# Patient Record
Sex: Male | Born: 1935 | Race: Black or African American | Hispanic: No | Marital: Married | Smoking: Former smoker
Health system: Southern US, Community
[De-identification: ages and names within clinical notes are randomized; demographics above are authoritative.]

## PROBLEM LIST (undated history)

## (undated) DIAGNOSIS — I219 Acute myocardial infarction, unspecified: Secondary | ICD-10-CM

## (undated) DIAGNOSIS — I10 Essential (primary) hypertension: Secondary | ICD-10-CM

## (undated) DIAGNOSIS — I639 Cerebral infarction, unspecified: Secondary | ICD-10-CM

## (undated) DIAGNOSIS — E119 Type 2 diabetes mellitus without complications: Secondary | ICD-10-CM

---

## 2006-07-15 ENCOUNTER — Inpatient Hospital Stay: Payer: Self-pay | Admitting: Internal Medicine

## 2006-07-15 ENCOUNTER — Other Ambulatory Visit: Payer: Self-pay

## 2006-07-15 IMAGING — CT CT CHEST W/ CM
1 of 2 series · 14 of 32 positions shown, 19 images · non-contrast
Comparison: none

REASON FOR EXAM: Widened mediastinum, evaluate for thoracic aneurysm
COMMENTS:   LMP: (Male)

[Series 4: aaa · axial · 0.70mm/px · z∈[-562,-274]mm · 14 of 108 slices shown, 19 images]
[im 6/108  soft-tissue]
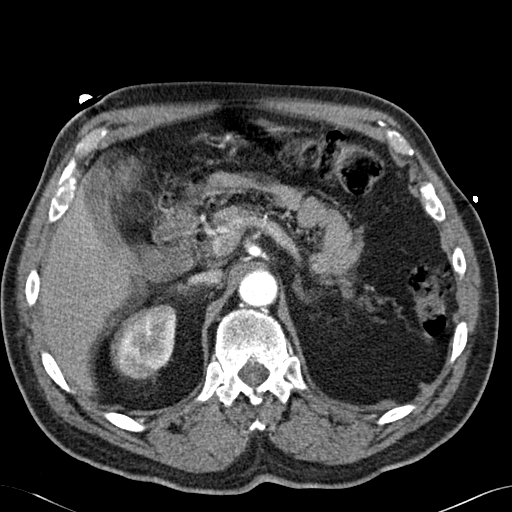
[im 6/108  bone]
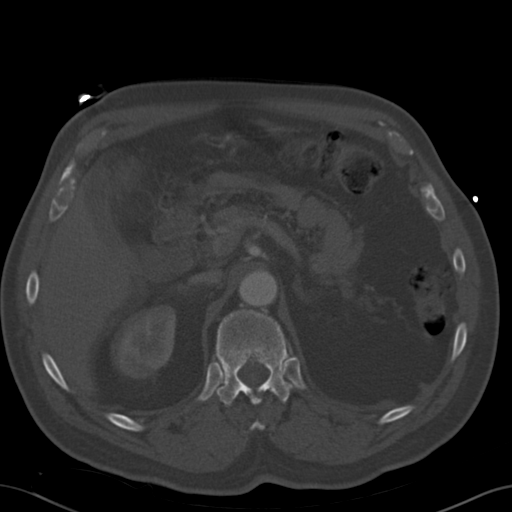
[im 17/108  soft-tissue]
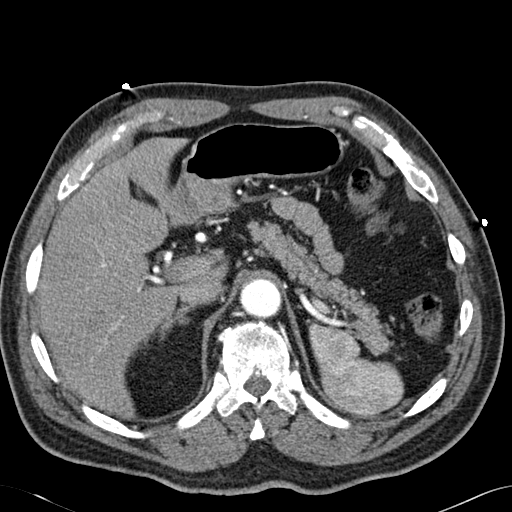
[im 23/108  soft-tissue]
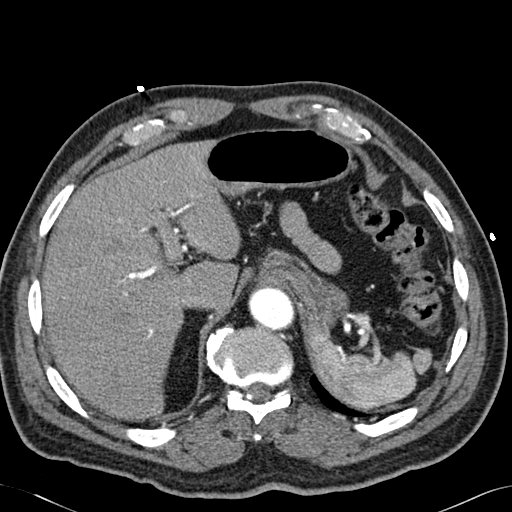
[im 29/108  soft-tissue]
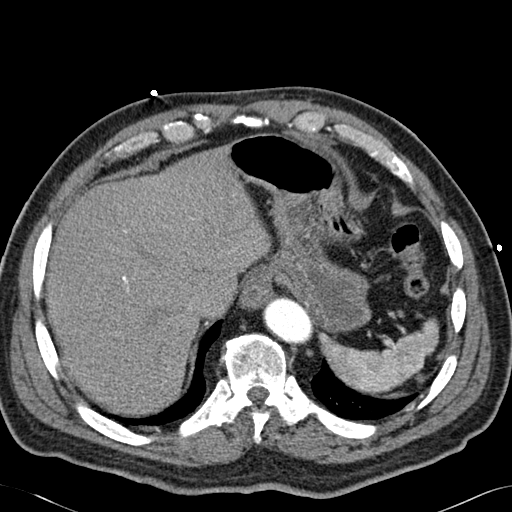
[im 40/108  soft-tissue]
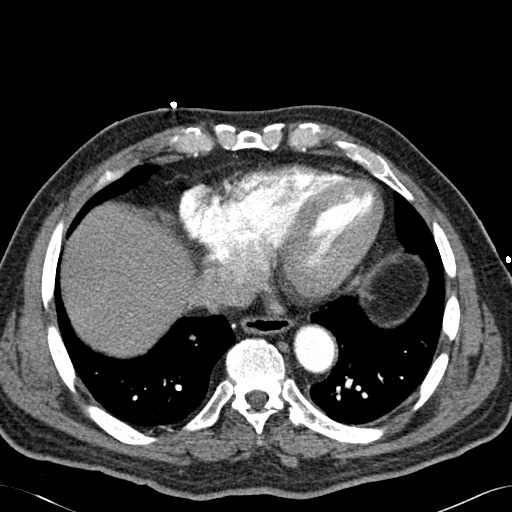
[im 46/108  soft-tissue]
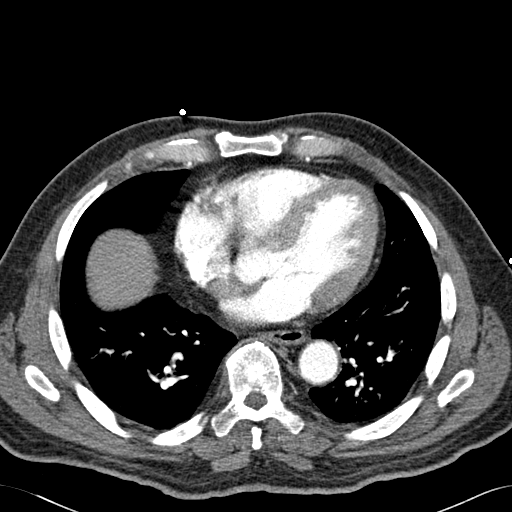
[im 57/108  soft-tissue]
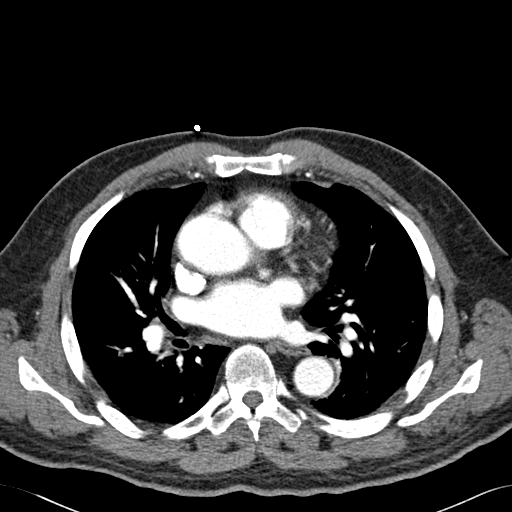
[im 62/108  soft-tissue]
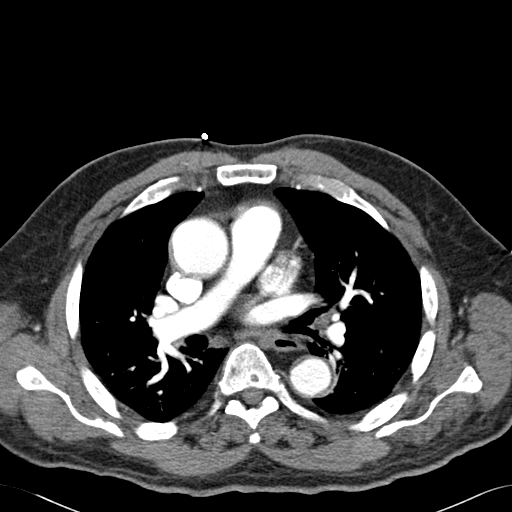
[im 68/108  soft-tissue]
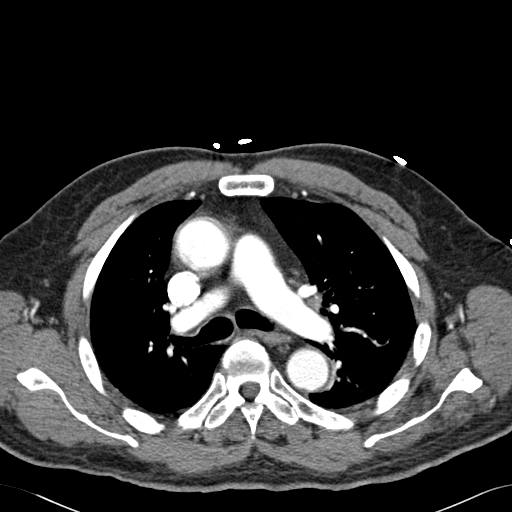
[im 68/108  bone]
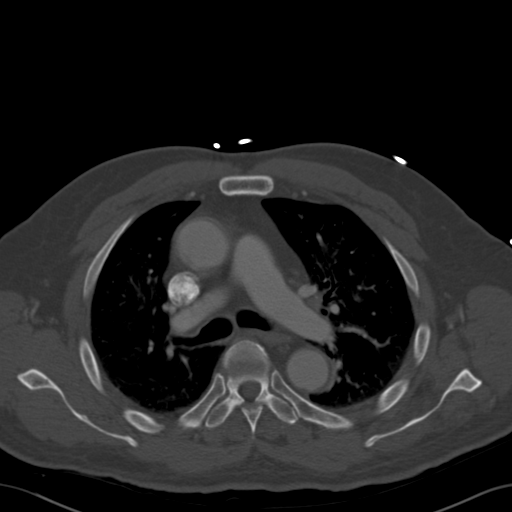
[im 79/108  soft-tissue]
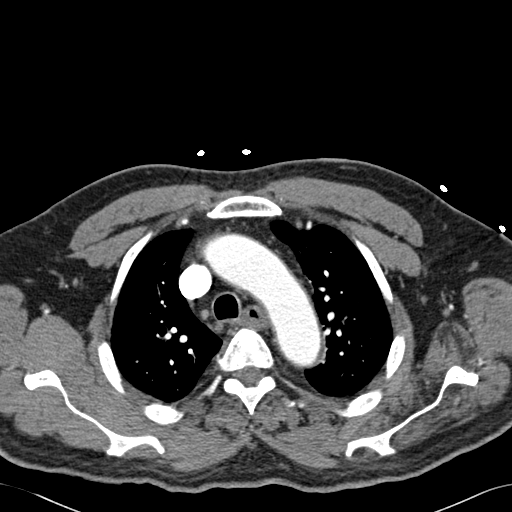
[im 85/108  soft-tissue]
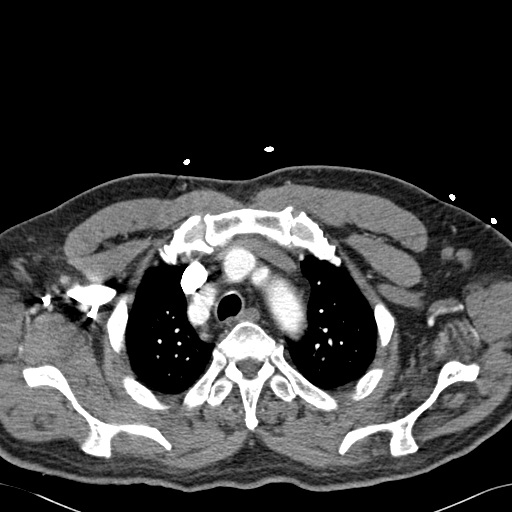
[im 85/108  lung]
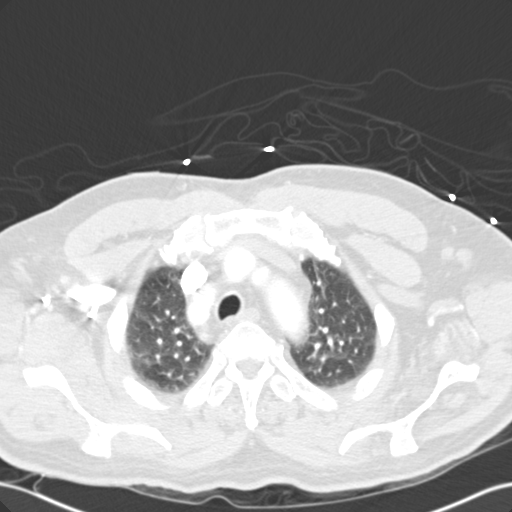
[im 91/108  soft-tissue]
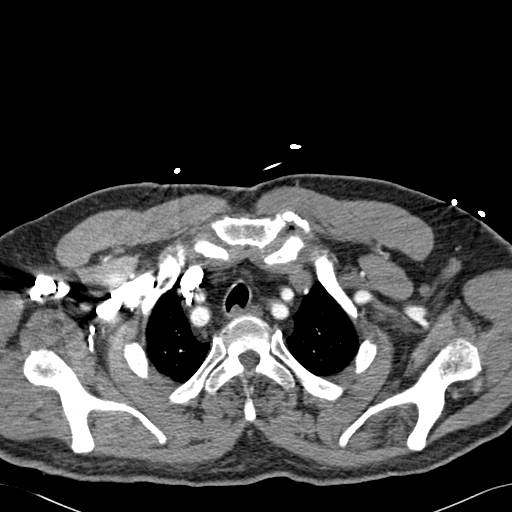
[im 91/108  lung]
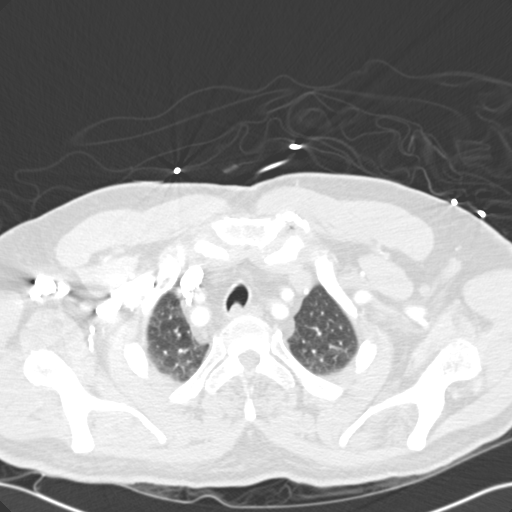
[im 96/108  lung]
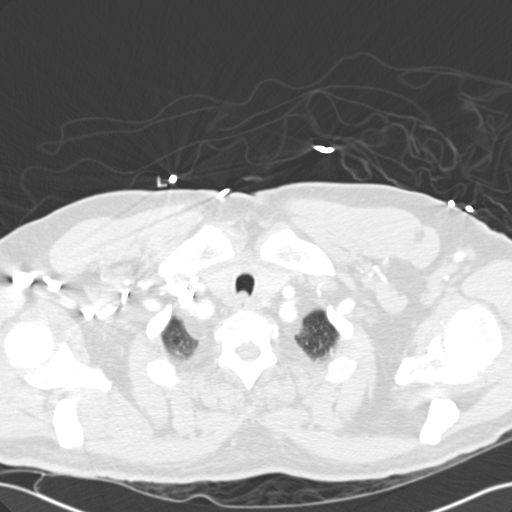
[im 102/108  soft-tissue]
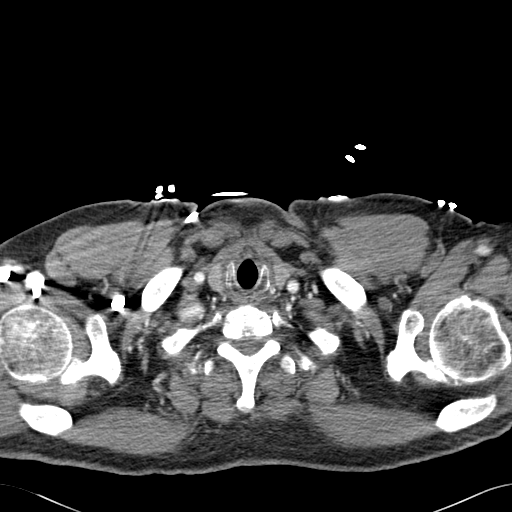
[im 102/108  lung]
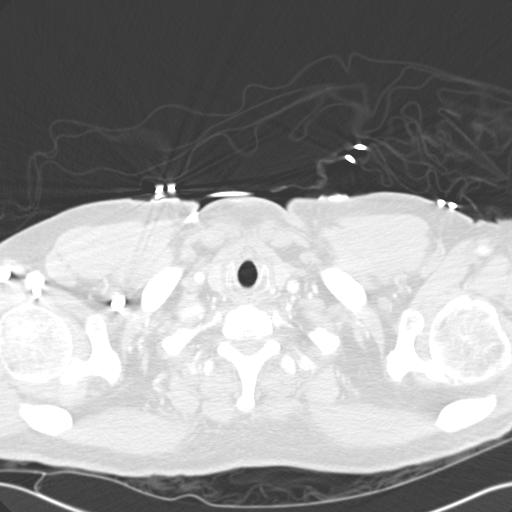

[14 of 32 positions shown; findings below may reference images not displayed]

PROCEDURE:     CT  - CT CHEST WITH CONTRAST  - [DATE] [DATE]

RESULT:     The patient received 100 ml of [G2] for this study.

The cardiac chambers are very mildly enlarged. There is no evidence of a
pericardial effusion. The caliber of the thoracic aorta is normal. There is
mild tortuosity of the ascending thoracic aorta. The central pulmonary
arteries are normal in caliber. There are no findings to suggest pulmonary
emboli. No pathologic sized mediastinal or hilar lymph nodes are present.
The retrosternal soft tissues are normal in appearance. I see no evidence of
a pleural effusion. At lung window settings, there is a small amount of
apical pleural thickening. The interstitial markings of both lungs are
mildly increased. I see no alveolar infiltrates and no abnormal nodules.
Within the upper abdomen, there is some motion artifact associated with
images of the lower portions of the liver. The observed portions of the
upper aspect of the liver appear normal. There is a hiatal hernia present.
IMPRESSION: 1.  I do not see evidence of a thoracic aortic aneurysm. There is mild
tortuosity of the ascending and descending thoracic aorta.
2.  The cardiac chambers are top normal to mildly enlarged.
3.  The pulmonary vascularity is mildly prominent centrally but there is no
evidence of significant alveolar edema. I cannot exclude minimal
interstitial edema.
4.  I see no pathologic sized mediastinal or hilar lymph nodes and there is
no evidence of a pleural effusion.

The findings were called to the [HOSPITAL] the conclusion of
the study.

## 2006-07-16 ENCOUNTER — Other Ambulatory Visit: Payer: Self-pay

## 2006-12-12 ENCOUNTER — Encounter: Payer: Self-pay | Admitting: Family Medicine

## 2007-01-07 ENCOUNTER — Encounter: Payer: Self-pay | Admitting: Family Medicine

## 2007-02-07 ENCOUNTER — Encounter: Payer: Self-pay | Admitting: Family Medicine

## 2008-06-19 ENCOUNTER — Emergency Department: Payer: Self-pay | Admitting: Emergency Medicine

## 2010-07-25 ENCOUNTER — Encounter: Payer: Self-pay | Admitting: Nurse Practitioner

## 2010-07-25 ENCOUNTER — Encounter: Payer: Self-pay | Admitting: Cardiothoracic Surgery

## 2010-08-07 ENCOUNTER — Encounter: Payer: Self-pay | Admitting: Cardiothoracic Surgery

## 2010-08-07 ENCOUNTER — Encounter: Payer: Self-pay | Admitting: Nurse Practitioner

## 2010-09-07 ENCOUNTER — Encounter: Payer: Self-pay | Admitting: Nurse Practitioner

## 2010-09-07 ENCOUNTER — Encounter: Payer: Self-pay | Admitting: Cardiothoracic Surgery

## 2010-10-08 ENCOUNTER — Encounter: Payer: Self-pay | Admitting: Nurse Practitioner

## 2010-10-08 ENCOUNTER — Encounter: Payer: Self-pay | Admitting: Cardiothoracic Surgery

## 2010-11-07 ENCOUNTER — Encounter: Payer: Self-pay | Admitting: Cardiothoracic Surgery

## 2010-11-07 ENCOUNTER — Encounter: Payer: Self-pay | Admitting: Nurse Practitioner

## 2010-12-08 ENCOUNTER — Encounter: Payer: Self-pay | Admitting: Nurse Practitioner

## 2010-12-08 ENCOUNTER — Encounter: Payer: Self-pay | Admitting: Cardiothoracic Surgery

## 2011-08-14 ENCOUNTER — Emergency Department: Payer: Self-pay | Admitting: Unknown Physician Specialty

## 2011-08-14 LAB — COMPREHENSIVE METABOLIC PANEL
Albumin: 3.2 g/dL — ABNORMAL LOW (ref 3.4–5.0)
Anion Gap: 8 (ref 7–16)
BUN: 9 mg/dL (ref 7–18)
Calcium, Total: 8.6 mg/dL (ref 8.5–10.1)
Co2: 25 mmol/L (ref 21–32)
EGFR (African American): >60
EGFR (Non-African Amer.): >60
Glucose: 116 mg/dL — ABNORMAL HIGH (ref 65–99)
Osmolality: 275 (ref 275–301)
Potassium: 3.5 mmol/L (ref 3.5–5.1)
SGOT(AST): 23 U/L (ref 15–37)
Sodium: 138 mmol/L (ref 136–145)
Total Protein: 7.3 g/dL (ref 6.4–8.2)

## 2011-08-14 LAB — CBC WITH DIFFERENTIAL/PLATELET
Basophil %: 0.1 %
Eosinophil #: 0 10*3/uL (ref 0.0–0.7)
Eosinophil %: 0 %
HCT: 39.8 % — ABNORMAL LOW (ref 40.0–52.0)
HGB: 12.9 g/dL — ABNORMAL LOW (ref 13.0–18.0)
Lymphocyte %: 7.6 %
MCH: 27.8 pg (ref 26.0–34.0)
MCHC: 32.5 g/dL (ref 32.0–36.0)
MCV: 86 fL (ref 80–100)
Monocyte #: 0.7 x10 3/mm (ref 0.2–1.0)
Neutrophil #: 11.1 10*3/uL — ABNORMAL HIGH (ref 1.4–6.5)
Neutrophil %: 86.5 %
RBC: 4.65 10*6/uL (ref 4.40–5.90)
WBC: 12.8 10*3/uL — ABNORMAL HIGH (ref 3.8–10.6)

## 2011-08-14 LAB — URINALYSIS, COMPLETE
Bilirubin,UR: NEGATIVE
Glucose,UR: >=500 mg/dL (ref 0–75)
Nitrite: POSITIVE
Ph: 6 (ref 4.5–8.0)
RBC,UR: 11 /HPF (ref 0–5)
Squamous Epithelial: NONE SEEN
WBC UR: 30 /HPF (ref 0–5)

## 2011-08-16 LAB — URINE CULTURE

## 2012-07-24 ENCOUNTER — Encounter: Payer: Self-pay | Admitting: Cardiothoracic Surgery

## 2012-07-24 ENCOUNTER — Encounter: Payer: Self-pay | Admitting: Nurse Practitioner

## 2012-08-04 LAB — WOUND CULTURE

## 2012-08-06 ENCOUNTER — Encounter: Payer: Self-pay | Admitting: Nurse Practitioner

## 2012-08-06 ENCOUNTER — Encounter: Payer: Self-pay | Admitting: Cardiothoracic Surgery

## 2012-09-06 ENCOUNTER — Encounter: Payer: Self-pay | Admitting: Cardiothoracic Surgery

## 2012-09-06 ENCOUNTER — Encounter: Payer: Self-pay | Admitting: Nurse Practitioner

## 2012-10-07 ENCOUNTER — Encounter: Payer: Self-pay | Admitting: Cardiothoracic Surgery

## 2016-05-09 ENCOUNTER — Emergency Department: Payer: Medicare Other

## 2016-05-09 ENCOUNTER — Encounter: Payer: Self-pay | Admitting: Emergency Medicine

## 2016-05-09 ENCOUNTER — Inpatient Hospital Stay
Admission: EM | Admit: 2016-05-09 | Discharge: 2016-05-11 | DRG: 066 | Disposition: A | Discharge status: Home / Self Care | Payer: Medicare Other | Attending: Internal Medicine | Admitting: Internal Medicine

## 2016-05-09 DIAGNOSIS — I63412 Cerebral infarction due to embolism of left middle cerebral artery: Secondary | ICD-10-CM | POA: Diagnosis not present

## 2016-05-09 DIAGNOSIS — R4701 Aphasia: Secondary | ICD-10-CM | POA: Diagnosis present

## 2016-05-09 DIAGNOSIS — F0390 Unspecified dementia without behavioral disturbance: Secondary | ICD-10-CM | POA: Diagnosis present

## 2016-05-09 DIAGNOSIS — R471 Dysarthria and anarthria: Secondary | ICD-10-CM | POA: Diagnosis present

## 2016-05-09 DIAGNOSIS — E119 Type 2 diabetes mellitus without complications: Secondary | ICD-10-CM | POA: Diagnosis present

## 2016-05-09 DIAGNOSIS — R29703 NIHSS score 3: Secondary | ICD-10-CM | POA: Diagnosis present

## 2016-05-09 DIAGNOSIS — I69398 Other sequelae of cerebral infarction: Secondary | ICD-10-CM

## 2016-05-09 DIAGNOSIS — Z8249 Family history of ischemic heart disease and other diseases of the circulatory system: Secondary | ICD-10-CM

## 2016-05-09 DIAGNOSIS — R4781 Slurred speech: Secondary | ICD-10-CM | POA: Diagnosis not present

## 2016-05-09 DIAGNOSIS — Z87891 Personal history of nicotine dependence: Secondary | ICD-10-CM

## 2016-05-09 DIAGNOSIS — Z79899 Other long term (current) drug therapy: Secondary | ICD-10-CM

## 2016-05-09 DIAGNOSIS — I252 Old myocardial infarction: Secondary | ICD-10-CM

## 2016-05-09 DIAGNOSIS — E785 Hyperlipidemia, unspecified: Secondary | ICD-10-CM | POA: Diagnosis present

## 2016-05-09 DIAGNOSIS — I6521 Occlusion and stenosis of right carotid artery: Secondary | ICD-10-CM | POA: Diagnosis present

## 2016-05-09 DIAGNOSIS — Z7984 Long term (current) use of oral hypoglycemic drugs: Secondary | ICD-10-CM

## 2016-05-09 DIAGNOSIS — E876 Hypokalemia: Secondary | ICD-10-CM | POA: Diagnosis not present

## 2016-05-09 DIAGNOSIS — I10 Essential (primary) hypertension: Secondary | ICD-10-CM | POA: Diagnosis present

## 2016-05-09 DIAGNOSIS — G459 Transient cerebral ischemic attack, unspecified: Secondary | ICD-10-CM | POA: Diagnosis present

## 2016-05-09 HISTORY — DX: Type 2 diabetes mellitus without complications: E11.9

## 2016-05-09 HISTORY — DX: Cerebral infarction, unspecified: I63.9

## 2016-05-09 HISTORY — DX: Acute myocardial infarction, unspecified: I21.9

## 2016-05-09 HISTORY — DX: Essential (primary) hypertension: I10

## 2016-05-09 LAB — URINALYSIS, ROUTINE W REFLEX MICROSCOPIC
Bilirubin Urine: NEGATIVE
GLUCOSE, UA: 150 mg/dL — AB
Hgb urine dipstick: NEGATIVE
KETONES UR: NEGATIVE mg/dL
LEUKOCYTES UA: NEGATIVE
Nitrite: NEGATIVE
PH: 6 (ref 5.0–8.0)
PROTEIN: NEGATIVE mg/dL
Specific Gravity, Urine: 1.016 (ref 1.005–1.030)

## 2016-05-09 LAB — COMPREHENSIVE METABOLIC PANEL
ALBUMIN: 3.9 g/dL (ref 3.5–5.0)
ALT: 13 U/L — AB (ref 17–63)
AST: 22 U/L (ref 15–41)
Alkaline Phosphatase: 81 U/L (ref 38–126)
Anion gap: 7 (ref 5–15)
BUN: 14 mg/dL (ref 6–20)
CHLORIDE: 102 mmol/L (ref 101–111)
CO2: 26 mmol/L (ref 22–32)
CREATININE: 0.99 mg/dL (ref 0.61–1.24)
Calcium: 8.8 mg/dL — ABNORMAL LOW (ref 8.9–10.3)
GFR calc Af Amer: >60 mL/min (ref >60)
Glucose, Bld: 130 mg/dL — ABNORMAL HIGH (ref 65–99)
POTASSIUM: 3.3 mmol/L — AB (ref 3.5–5.1)
SODIUM: 135 mmol/L (ref 135–145)
Total Bilirubin: 0.4 mg/dL (ref 0.3–1.2)
Total Protein: 7 g/dL (ref 6.5–8.1)

## 2016-05-09 LAB — GLUCOSE, CAPILLARY
GLUCOSE-CAPILLARY: 143 mg/dL — AB (ref 65–99)
Glucose-Capillary: 93 mg/dL (ref 65–99)

## 2016-05-09 LAB — DIFFERENTIAL
BASOS ABS: 0 10*3/uL (ref 0–0.1)
BASOS PCT: 1 %
EOS ABS: 0.1 10*3/uL (ref 0–0.7)
Eosinophils Relative: 2 %
Lymphocytes Relative: 32 %
Lymphs Abs: 1.2 10*3/uL (ref 1.0–3.6)
MONOS PCT: 8 %
Monocytes Absolute: 0.3 10*3/uL (ref 0.2–1.0)
NEUTROS ABS: 2.2 10*3/uL (ref 1.4–6.5)
Neutrophils Relative %: 57 %

## 2016-05-09 LAB — CBC
HCT: 41.5 % (ref 40.0–52.0)
HEMOGLOBIN: 13.6 g/dL (ref 13.0–18.0)
MCH: 28.9 pg (ref 26.0–34.0)
MCHC: 32.8 g/dL (ref 32.0–36.0)
MCV: 88.2 fL (ref 80.0–100.0)
PLATELETS: 184 10*3/uL (ref 150–440)
RBC: 4.7 MIL/uL (ref 4.40–5.90)
RDW: 13.6 % (ref 11.5–14.5)
WBC: 3.8 10*3/uL (ref 3.8–10.6)

## 2016-05-09 LAB — TROPONIN I

## 2016-05-09 LAB — PROTIME-INR
INR: 0.89
PROTHROMBIN TIME: 12 s (ref 11.4–15.2)

## 2016-05-09 LAB — APTT: aPTT: 27 seconds (ref 24–36)

## 2016-05-09 MED ORDER — ATORVASTATIN CALCIUM 20 MG PO TABS
40.0000 mg | ORAL_TABLET | Freq: Every day | ORAL | Status: DC
  Administered 2016-05-09 – 2016-05-11 (×3): 40 mg via ORAL
  Filled 2016-05-09 (×3): qty 2

## 2016-05-09 MED ORDER — ALBUTEROL SULFATE (2.5 MG/3ML) 0.083% IN NEBU: 2.5000 mg | INHALATION_SOLUTION | RESPIRATORY_TRACT | Status: DC | PRN

## 2016-05-09 MED ORDER — INSULIN ASPART 100 UNIT/ML ~~LOC~~ SOLN
0.0000 [IU] | Freq: Three times a day (TID) | SUBCUTANEOUS | Status: DC
  Administered 2016-05-10: 12:00:00 1 [IU] via SUBCUTANEOUS
  Filled 2016-05-09: qty 1

## 2016-05-09 MED ORDER — ASPIRIN 81 MG PO CHEW
324.0000 mg | CHEWABLE_TABLET | Freq: Once | ORAL | Status: AC
  Administered 2016-05-09: 324 mg via ORAL
  Filled 2016-05-09: qty 4

## 2016-05-09 MED ORDER — ONDANSETRON HCL 4 MG PO TABS: 4.0000 mg | ORAL_TABLET | Freq: Four times a day (QID) | ORAL | Status: DC | PRN

## 2016-05-09 MED ORDER — BISACODYL 10 MG RE SUPP
10.0000 mg | Freq: Every day | RECTAL | Status: DC | PRN
  Filled 2016-05-09: qty 1

## 2016-05-09 MED ORDER — LISINOPRIL 20 MG PO TABS
20.0000 mg | ORAL_TABLET | Freq: Two times a day (BID) | ORAL | Status: DC
  Administered 2016-05-09 – 2016-05-11 (×4): 20 mg via ORAL
  Filled 2016-05-09 (×4): qty 1

## 2016-05-09 MED ORDER — POTASSIUM CHLORIDE 20 MEQ PO PACK
40.0000 meq | PACK | Freq: Once | ORAL | Status: AC
  Administered 2016-05-09: 23:00:00 40 meq via ORAL
  Filled 2016-05-09: qty 2

## 2016-05-09 MED ORDER — ASPIRIN-DIPYRIDAMOLE ER 25-200 MG PO CP12
1.0000 | ORAL_CAPSULE | Freq: Every day | ORAL | Status: DC
  Administered 2016-05-10 – 2016-05-11 (×2): 1 via ORAL
  Filled 2016-05-09 (×2): qty 1

## 2016-05-09 MED ORDER — HYDRALAZINE HCL 20 MG/ML IJ SOLN: 10.0000 mg | Freq: Four times a day (QID) | INTRAMUSCULAR | Status: DC | PRN

## 2016-05-09 MED ORDER — POLYETHYLENE GLYCOL 3350 17 G PO PACK: 17.0000 g | PACK | Freq: Every day | ORAL | Status: DC | PRN

## 2016-05-09 MED ORDER — METFORMIN HCL 500 MG PO TABS
500.0000 mg | ORAL_TABLET | Freq: Every day | ORAL | Status: DC
  Administered 2016-05-10: 08:00:00 500 mg via ORAL
  Filled 2016-05-09: qty 1

## 2016-05-09 MED ORDER — AMLODIPINE BESYLATE 10 MG PO TABS
10.0000 mg | ORAL_TABLET | Freq: Every day | ORAL | Status: DC
  Administered 2016-05-10 – 2016-05-11 (×2): 10 mg via ORAL
  Filled 2016-05-09 (×2): qty 1

## 2016-05-09 MED ORDER — DOCUSATE SODIUM 100 MG PO CAPS
100.0000 mg | ORAL_CAPSULE | Freq: Two times a day (BID) | ORAL | Status: DC
  Administered 2016-05-09 – 2016-05-11 (×4): 100 mg via ORAL
  Filled 2016-05-09 (×4): qty 1

## 2016-05-09 MED ORDER — ENOXAPARIN SODIUM 40 MG/0.4ML ~~LOC~~ SOLN
40.0000 mg | SUBCUTANEOUS | Status: DC
  Administered 2016-05-09 – 2016-05-10 (×2): 40 mg via SUBCUTANEOUS
  Filled 2016-05-09 (×2): qty 0.4

## 2016-05-09 MED ORDER — SODIUM CHLORIDE 0.9% FLUSH
3.0000 mL | Freq: Two times a day (BID) | INTRAVENOUS | Status: DC
  Administered 2016-05-09 – 2016-05-10 (×3): 3 mL via INTRAVENOUS

## 2016-05-09 MED ORDER — ONDANSETRON HCL 4 MG/2ML IJ SOLN: 4.0000 mg | Freq: Four times a day (QID) | INTRAMUSCULAR | Status: DC | PRN

## 2016-05-09 MED ORDER — FERROUS SULFATE 325 (65 FE) MG PO TABS
325.0000 mg | ORAL_TABLET | Freq: Every day | ORAL | Status: DC
  Administered 2016-05-10: 325 mg via ORAL
  Filled 2016-05-09: qty 1

## 2016-05-09 NOTE — ED Provider Notes (Signed)
[facility] Emergency Department Provider Note  Time seen: 6:44 PM  I have reviewed the triage vital signs and the nursing notes.   HISTORY  Chief Complaint Aphasia    HPI Stephen [name] is a 83 y.o. male with a past medical history of diabetes, hypertension, MI, CVA, presents to the emergency department for difficulty speaking.According to family around 2 PM today the patient was noted to be acting normal. No deficits, family states the patient is usually alert and oriented 4 conversant normally. Ambulates with a cane. Patient took a nap after lunch and upon awakening this afternoon they noted the patient have difficulty speaking. They state the patient cannot get his words out and appeared to be confused. They called EMS, EMS states upon arrival the patient began speaking but appeared to be confused and appeared to continue to have difficulty getting his words out with very slow speech. Upon arrival to the emergency department patient is largely unchanged he is speaking but is having difficulty getting his words out with very slow speech. He appears to be confused, states the year is 37. Patient is able to follow commands.  Past Medical History:  Diagnosis Date  . Diabetes mellitus without complication (HCC)   . Hypertension   . MI (myocardial infarction)   . Stroke (cerebrum) (HCC)     There are no active problems to display for this patient.   History reviewed. No pertinent surgical history.  Prior to Admission medications   Not on File    No Known Allergies  No family history on file.  Social History Social History  Substance Use Topics  . Smoking status: Former Games developer  . Smokeless tobacco: Never Used  . Alcohol use No    Review of Systems Constitutional: Negative for fever Cardiovascular: Negative for chest pain. Respiratory: Negative for shortness of breath. Gastrointestinal: Negative for abdominal pain Neurological: Negative for  headaches, focal weakness or numbness.Patient denies any difficulty speaking although he is clearly having trouble. Denies any confusion or difficulty thinking. 10-point ROS otherwise negative.  ____________________________________________   PHYSICAL EXAM:  VITAL SIGNS: ED Triage Vitals  Enc Vitals Group     BP 05/09/16 1813 (!) 181/105     Pulse Rate 05/09/16 1813 82     Resp 05/09/16 1813 18     Temp --      Temp src --      SpO2 05/09/16 1813 96 %     Weight 05/09/16 1835 195 lb (88.5 kg)     Height 05/09/16 1835 6' (1.829 m)     Head Circumference --      Peak Flow --      Pain Score --      Pain Loc --      Pain Edu? --      Excl. in GC? --     Constitutional: Alert and oriented. Well appearing and in no distress. Eyes: Normal exam ENT   Head: Normocephalic and atraumatic   Mouth/Throat: Mucous membranes are moist. Cardiovascular: Normal rate, regular rhythm. No murmur Respiratory: Normal respiratory effort without tachypnea nor retractions. Breath sounds are clear  Gastrointestinal: Soft and nontender. No distention.   Musculoskeletal: Nontender with normal range of motion in all extremities.  Neurologic:  Normal speech and language. No gross focal neurologic deficits Skin:  Skin is warm, dry and intact.  Psychiatric: Mood and affect are normal.   ____________________________________________    EKG  Addendum interpreted by myself shows normal sinus rhythm  at 71 bpm, narrow QRS, left axis deviation, largely normal intervals and nonspecific ST changes. No ST elevation.  ____________________________________________    RADIOLOGY  IMPRESSION: 1. No acute intracranial process. 2. ASPECTS is 10. 3. 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 Severe chronic small vessel ischemic disease.  ____________________________________________   INITIAL IMPRESSION / ASSESSMENT AND PLAN / ED COURSE  Pertinent labs & imaging results that were available during my care of  the patient were reviewed by me and considered in my medical decision making (see chart for details).  Patient presents to the emergency department by EMS for difficulty speaking. Code stroke initiated upon arrival. Overall the patient appears well but does appear to continue to have difficulty speaking and getting his words out. Appears to be quite sluggish and slow with responses with confusion saying the year is 37. Currently awaiting CT scan read however on my review patient appears to have an old CVA but no acute abnormality. We will have our telemetry neurologist speak with the patient.    Patient has been seen by telemetry neurology and they believe the patient is likely suffering from a TIA, they've an NIH stroke scale of 0 currently. They recommended no TPA but admitted to the hospital for further workup including MRI.  His labs are resulted largely within normal limits. Troponin negative. CT head shows old CVA but no acute abnormality. Patient will be admitted to the hospital for further workup.  NIH Stroke Scale   Interval: arriva Time: 7:21 PM Person Administering Scale: Loreli Debruler  Administer stroke scale items in the order listed. Record performance in each category after each subscale exam. Do not go back and change scores. Follow directions provided for each exam technique. Scores should reflect what the patient does, not what the clinician thinks the patient can do. The clinician should record answers while administering the exam and work quickly. Except where indicated, the patient should not be coached (i.e., repeated requests to patient to make a special effort).   1a  Level of consciousness: 0=alert; keenly responsive  1b. LOC questions:  0=Performs both tasks correctly  1c. LOC commands: 0=Performs both tasks correctly  2.  Best Gaze: 0=normal  3.  Visual: 0=No visual loss  4. Facial Palsy: 0=Normal symmetric movement  5a.  Motor left arm: 0=No drift, limb  holds 90 (or 45) degrees for full 10 seconds  5b.  Motor right arm: 0=No drift, limb holds 90 (or 45) degrees for full 10 seconds  6a. motor left leg: 0=No drift, limb holds 90 (or 45) degrees for full 10 seconds  6b  Motor right leg:  0=No drift, limb holds 90 (or 45) degrees for full 10 seconds  7. Limb Ataxia: 0=Absent  8.  Sensory: 0=Normal; no sensory loss  9. Best Language:  1=Mild to moderate aphasia; some obvious loss of fluency or facility of comprehension without significant limitation on ideas expressed or form of expression.  10. Dysarthria: 1=Mild to moderate, patient slurs at least some words and at worst, can be understood with some difficulty  11. Extinction and Inattention: 0=No abnormality  12. Distal motor function: 0=Normal   Total:   2       ____________________________________________   FINAL CLINICAL IMPRESSION(S) / ED DIAGNOSES  CVA Dysarthria    Minna Antis, MD 05/09/16 Ernestina [place]

## 2016-05-09 NOTE — H&P (Signed)
[employer] - [place] at [facility]   PATIENT NAME: Stephen [name]    MR#:  [id number]  DATE OF BIRTH:  [birth year]  DATE OF ADMISSION:  05/09/2016  PRIMARY CARE PHYSICIAN: [facility]   REQUESTING/REFERRING PHYSICIAN: Dr. Lenard Lance  CHIEF COMPLAINT:   Chief Complaint  Patient presents with  . Aphasia    HISTORY OF PRESENT ILLNESS:  Stephen [name]  is a 83 y.o. male with a known history of CVA, hypertension, diabetes, mild dementia presents to the emergency room with acute onset of slurred speech at 2 PM today. By the time he arrived to the emergency room symptoms are improving. At this time his speech is close to normal with minimal slurred speech as per family. Patient is a poor historian. Does have memory issues at baseline. He was confused per family previously and is slowly improving. Most of the history obtained by reviewing old records and discussing with family at bedside. He does have chronic right-sided weakness from prior stroke which is unchanged. No trouble swallowing. No change in vision. CT scan of the head showed old strokes but nothing acute.  PAST MEDICAL HISTORY:   Past Medical History:  Diagnosis Date  . Diabetes mellitus without complication (HCC)   . Hypertension   . MI (myocardial infarction)   . Stroke (cerebrum) (HCC)     PAST SURGICAL HISTORY:  History reviewed. No pertinent surgical history.  SOCIAL HISTORY:   Social History  Substance Use Topics  . Smoking status: Former Games developer  . Smokeless tobacco: Never Used  . Alcohol use No    FAMILY HISTORY:   Family History  Problem Relation Age of Onset  . Hypertension Other     DRUG ALLERGIES:  No Known Allergies  REVIEW OF SYSTEMS:   Review of Systems  Constitutional: Positive for malaise/fatigue. Negative for chills, fever and weight loss.  HENT: Negative for hearing loss and nosebleeds.   Eyes: Negative for blurred vision, double vision and pain.   Respiratory: Negative for cough, hemoptysis, sputum production, shortness of breath and wheezing.   Cardiovascular: Negative for chest pain, palpitations, orthopnea and leg swelling.  Gastrointestinal: Negative for abdominal pain, constipation, diarrhea, nausea and vomiting.  Genitourinary: Negative for dysuria and hematuria.  Musculoskeletal: Negative for back pain, falls and myalgias.  Skin: Negative for rash.  Neurological: Positive for speech change and focal weakness. Negative for dizziness, tremors, sensory change, seizures and headaches.  Endo/Heme/Allergies: Does not bruise/bleed easily.  Psychiatric/Behavioral: Negative for depression and memory loss. The patient is not nervous/anxious.     MEDICATIONS AT HOME:   Prior to Admission medications   Medication Sig Start Date End Date Taking? Authorizing Provider  amLODipine (NORVASC) 10 MG tablet Take 10 mg by mouth daily. 05/09/16  Yes Historical Provider, MD  atorvastatin (LIPITOR) 40 MG tablet Take 40 mg by mouth daily. 04/11/16  Yes Historical Provider, MD  dipyridamole-aspirin (AGGRENOX) 200-25 MG 12hr capsule Take 1 capsule by mouth daily.   Yes Historical Provider, MD  ferrous sulfate 325 (65 FE) MG tablet Take 325 mg by mouth daily with breakfast.   Yes Historical Provider, MD  IBU 800 MG tablet Take 800 mg by mouth 2 (two) times daily as needed.  05/09/16  Yes Historical Provider, MD  lisinopril (PRINIVIL,ZESTRIL) 20 MG tablet Take 20 mg by mouth 2 (two) times daily.  05/09/16  Yes Historical Provider, MD  metFORMIN (GLUCOPHAGE) 500 MG tablet Take 500 mg by mouth daily. 05/09/16  Yes Historical Provider, MD  VITAL SIGNS:  Blood pressure (!) 169/88, pulse 75, resp. rate 19, height 6' (1.829 m), weight 88.5 kg (195 lb), SpO2 96 %.  PHYSICAL EXAMINATION:  Physical Exam  GENERAL:  83 y.o.-year-old patient lying in the bed with no acute distress.  EYES: Pupils equal, round, reactive to light and accommodation. No scleral icterus.  Extraocular muscles intact.  HEENT: Head atraumatic, normocephalic. Oropharynx and nasopharynx clear. No oropharyngeal erythema, moist oral mucosa  NECK:  Supple, no jugular venous distention. No thyroid enlargement, no tenderness.  LUNGS: Normal breath sounds bilaterally, no wheezing, rales, rhonchi. No use of accessory muscles of respiration.  CARDIOVASCULAR: S1, S2 normal. No murmurs, rubs, or gallops.  ABDOMEN: Soft, nontender, nondistended. Bowel sounds present. No organomegaly or mass.  EXTREMITIES: No pedal edema, cyanosis, or clubbing. + 2 pedal & radial pulses b/l.   NEUROLOGIC: Cranial nerves II through XII are intact. Sensation to fine touch intact all over. Motor strength 4/5 on right side and 5 over 5 on left side. Some slurred speech. PSYCHIATRIC: The patient is alert and Awake. Oriented to place and person but not time. SKIN: No obvious rash, lesion, or ulcer.   LABORATORY PANEL:   CBC  Recent Labs Lab 05/09/16 1834  WBC 3.8  HGB 13.6  HCT 41.5  PLT 184   ------------------------------------------------------------------------------------------------------------------  Chemistries   Recent Labs Lab 05/09/16 1834  NA 135  K 3.3*  CL 102  CO2 26  GLUCOSE 130*  BUN 14  CREATININE 0.99  CALCIUM 8.8*  AST 22  ALT 13*  ALKPHOS 81  BILITOT 0.4   ------------------------------------------------------------------------------------------------------------------  Cardiac Enzymes  Recent Labs Lab 05/09/16 1834  TROPONINI <0.03   ------------------------------------------------------------------------------------------------------------------  RADIOLOGY:  Ct Head Code Stroke W/o Cm  Result Date: 05/09/2016 CLINICAL DATA:  Code stroke. Slurred speech. Word-finding difficulties. Assess for stroke. History of hypertension, diabetes and stroke. EXAM: CT HEAD WITHOUT CONTRAST TECHNIQUE: Contiguous axial images were obtained from the base of the skull through the  vertex without intravenous contrast. COMPARISON:  MRI of the head July 16, 2006 FINDINGS: BRAIN: No intraparenchymal hemorrhage, mass effect nor midline shift. Old large RIGHT superior cerebellar infarct with ex vacuo dilatation fourth ventricle, RIGHT brachium pontis atrophy. Confluent supratentorial white matter hypodensities. No acute large vascular territory infarcts. Similarly prominent bifrontal extra-axial fluid spaces. 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 Basal cisterns are patent. VASCULAR: Moderate calcific atherosclerosis of the carotid siphons with dolichoectasia compatible chronic hypertension. SKULL: No skull fracture. Small LEFT frontal scalp hematoma or mass, recommend direct inspection. LEFT suboccipital scalp lipoma. SINUSES/ORBITS: The mastoid air-cells and included paranasal sinuses are well-aerated.The included ocular globes and orbital contents are non-suspicious. OTHER: None. ASPECTS [facility] Stroke Program Early CT Score) - Ganglionic level infarction (caudate, lentiform nuclei, internal capsule, insula, M1-M3 cortex): 7 - Supraganglionic infarction (M4-M6 cortex): 3 Total score (0-10 with 10 being normal): 10 IMPRESSION: 1. No acute intracranial process. 2. ASPECTS is 10. 3. 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 Severe chronic small vessel ischemic disease. Critical Value/emergent results were called by telephone at the time of interpretation on 05/09/2016 at 6:52 pm to Dr. Minna Antis , who verbally acknowledged these results. Electronically Signed   By: Awilda Metro M.D.   On: 05/09/2016 18:53     IMPRESSION AND PLAN:   * Acute Onset slurred speech due to CVA CT scan of the head showed nothing acute. -Check MRI of the brain, Carotid dopplers, Echo - Start Aggrenox and statin. - Lovenox for DVT prophylaxis. - PT/OT/Speech consult as needed  per symptoms - Neuro checks every 4 hours for 24 hours. - Consult neurology.  * Hypertension. Amlodipine and lisinopril.  * Diabetes mellitus type  2. On metformin. Add sliding scale insulin.  * Mild dementia. Watch for inpatient delirium.  * DVT prophylaxis with Lovenox.   All the records are reviewed and case discussed with ED provider. Management plans discussed with the patient, family and they are in agreement.  CODE STATUS: FULL CODE  TOTAL TIME TAKING CARE OF THIS PATIENT: 40 minutes.   Milagros Loll R M.D on 05/09/2016 at 8:14 PM  Between 7am to 6pm - Pager - [phone]  After 6pm go to [email or web address] - password [email or web address]  [facility] [place] [facility]  Office  [phone]  CC: Primary care physician; [facility]  Note: This dictation was prepared with Dragon dictation along with smaller phrase technology. Any transcriptional errors that result from this process are unintentional.

## 2016-05-09 NOTE — ED Notes (Signed)
Spoke with MRI tech. States it will be in the morning before MRI is able to be completed.

## 2016-05-09 NOTE — ED Triage Notes (Signed)
Patient from home via [facility]. Family reports at approximately 2pm patient began having slurred speech and trouble getting words out. Upon arrival patient is having acute dysarthria. Oriented to person and place. No motor deficits noted at this time.

## 2016-05-09 NOTE — ED Notes (Signed)
CODE STROKE CALLED TO [phone] 

## 2016-05-10 ENCOUNTER — Observation Stay (HOSPITAL_BASED_OUTPATIENT_CLINIC_OR_DEPARTMENT_OTHER)
Admit: 2016-05-10 | Discharge: 2016-05-10 | Disposition: A | Discharge status: Home / Self Care | Payer: Medicare Other | Attending: Internal Medicine | Admitting: Internal Medicine

## 2016-05-10 ENCOUNTER — Observation Stay: Payer: Medicare Other

## 2016-05-10 DIAGNOSIS — G459 Transient cerebral ischemic attack, unspecified: Secondary | ICD-10-CM

## 2016-05-10 DIAGNOSIS — I639 Cerebral infarction, unspecified: Secondary | ICD-10-CM | POA: Diagnosis not present

## 2016-05-10 LAB — GLUCOSE, CAPILLARY
GLUCOSE-CAPILLARY: 104 mg/dL — AB (ref 65–99)
Glucose-Capillary: 85 mg/dL (ref 65–99)
Glucose-Capillary: 123 mg/dL — ABNORMAL HIGH (ref 65–99)
Glucose-Capillary: 89 mg/dL (ref 65–99)

## 2016-05-10 LAB — LIPID PANEL
Cholesterol: 176 mg/dL (ref 0–200)
HDL: 57 mg/dL (ref >40)
LDL CALC: 104 mg/dL — AB (ref 0–99)
TRIGLYCERIDES: 77 mg/dL (ref <150)
Total CHOL/HDL Ratio: 3.1 RATIO
VLDL: 15 mg/dL (ref 0–40)

## 2016-05-10 LAB — ECHOCARDIOGRAM COMPLETE
Height: 72 in
WEIGHTICAEL: 3200 [oz_av]

## 2016-05-10 LAB — TSH: TSH: 1.131 u[IU]/mL (ref 0.350–4.500)

## 2016-05-10 MED ORDER — CLOPIDOGREL BISULFATE 75 MG PO TABS: 75.0000 mg | ORAL_TABLET | Freq: Every day | ORAL | 0 refills | Status: DC

## 2016-05-10 MED ORDER — POLYETHYLENE GLYCOL 3350 17 G PO PACK: 17.0000 g | PACK | Freq: Every day | ORAL | 0 refills | Status: DC | PRN

## 2016-05-10 MED ORDER — POTASSIUM CHLORIDE CRYS ER 20 MEQ PO TBCR
40.0000 meq | EXTENDED_RELEASE_TABLET | Freq: Once | ORAL | Status: AC
  Administered 2016-05-10: 11:00:00 40 meq via ORAL
  Filled 2016-05-10: qty 2

## 2016-05-10 MED ORDER — ASPIRIN EC 81 MG PO TBEC: 81.0000 mg | DELAYED_RELEASE_TABLET | Freq: Every day | ORAL | Status: DC

## 2016-05-10 NOTE — Evaluation (Signed)
Clinical/Bedside Swallow Evaluation Patient Details  Name: Stephen [name] MRN: [id number] Date of Birth: [birth year]  Today's Date: 05/10/2016 Time: SLP Start Time (ACUTE ONLY): 0950 SLP Stop Time (ACUTE ONLY): 1050 SLP Time Calculation (min) (ACUTE ONLY): 60 min  Past Medical History:  Past Medical History:  Diagnosis Date  . Diabetes mellitus without complication (HCC)   . Hypertension   . MI (myocardial infarction)   . Stroke (cerebrum) [facility])    Past Surgical History: History reviewed. No pertinent surgical history. HPI:  Pt  is a 83 y.o. male with a known history of CVA, hypertension, diabetes, mild dementia presents to the emergency room with acute onset of slurred speech at 2 PM today. By the time he arrived to the emergency room symptoms are improving. At this time his speech is close to normal with minimal slurred speech as per family. Patient is a poor historian. Does have memory issues at baseline. He was confused per family previously and is slowly improving. Most of the history obtained by reviewing old records and discussing with family at bedside. He does have chronic right-sided weakness from prior stroke which is unchanged. Currently, pt is verbally responsive and conversive; speech fluency c/b min hesitations intermittently and slightly mumbled but overall intelligible. Will f/u w/ family if close to his baseline secondary to old CVA. NSG indicated adequate toleration of pills w/ water.    Assessment / Plan / Recommendation Clinical Impression  Pt appears to adequately tolerate oral intake w/ reduced risk for aspiration following just general aspiration precautions. Pt consumed sips of thin liquids via cup/straw then solids w/ no overt s/s of aspiration noted. Oral phase appeared wfl for bolus management of trials given. Pt does wear dentures; slightly declined condition but adequate for pt. Pt is able to feed self w/ min setup assist. NSG recently gave all Pills w/ water w/ no  deficits noted. Pt appears at his baseline w/ swallowing; he denies any decline in his speech-language abilities stating he is "talking fine now". Family has indicated pt appears to be communicating at his baseline. Recommend a regular/mech soft diet(meats cut) w/ thin liquids; general aspiration precautions. NSG to reconsult if any change in status while admitted. Pt/family agreed. SLP Visit Diagnosis: Dysphagia, unspecified (R13.10)    Aspiration Risk   (reduced)    Diet Recommendation  Regular/Mech Soft(meats); Thin liquids. General aspiration precautions.  Medication Administration: Whole meds with liquid (or whole in Puree if needed)    Other  Recommendations Recommended Consults:  (none indicated) Oral Care Recommendations: Oral care BID;Staff/trained caregiver to provide oral care   Follow up Recommendations None      Frequency and Duration  n/a         Prognosis Prognosis for Safe Diet Advancement: Good      Swallow Study   General Date of Onset: 05/09/16 HPI: Pt  is a 83 y.o. male with a known history of CVA, hypertension, diabetes, mild dementia presents to the emergency room with acute onset of slurred speech at 2 PM today. By the time he arrived to the emergency room symptoms are improving. At this time his speech is close to normal with minimal slurred speech as per family. Patient is a poor historian. Does have memory issues at baseline. He was confused per family previously and is slowly improving. Most of the history obtained by reviewing old records and discussing with family at bedside. He does have chronic right-sided weakness from prior stroke which is unchanged. Currently, pt is  verbally responsive and conversive; speech fluency c/b min hesitations intermittently and slightly mumbled but overall intelligible. Will f/u w/ family if close to his baseline secondary to old CVA. NSG indicated adequate toleration of pills w/ water.  Type of Study: Bedside Swallow  Evaluation Previous Swallow Assessment: none indicated Diet Prior to this Study: Regular;Thin liquids Temperature Spikes Noted: No (wbc not elevated) Respiratory Status: Room air History of Recent Intubation: No Behavior/Cognition: Alert;Cooperative;Pleasant mood;Distractible;Requires cueing (min) Oral Cavity Assessment: Within Functional Limits Oral Care Completed by SLP: Recent completion by staff Oral Cavity - Dentition: Dentures, top;Dentures, bottom;Poor condition Vision: Functional for self-feeding Self-Feeding Abilities: Able to feed self;Needs assist;Needs set up Patient Positioning: Upright in bed Baseline Vocal Quality: Normal;Low vocal intensity (slightly mumbled speech - may be at baseline?) Volitional Cough: Strong Volitional Swallow: Able to elicit    Oral/Motor/Sensory Function Overall Oral Motor/Sensory Function: Within functional limits   Ice Chips Ice chips: Not tested   Thin Liquid Thin Liquid: Within functional limits Presentation: Cup;Self Fed;Straw (~6 ozs) Other Comments: pt was drinking thin liquids upon entering room    Nectar Thick Nectar Thick Liquid: Not tested   Honey Thick Honey Thick Liquid: Not tested   Puree Puree: Within functional limits Presentation: Self Fed;Spoon (4 trials)   Solid   GO   Solid: Within functional limits (mech soft/moist) Presentation: Self Fed (3 trials)    Functional Assessment Tool Used: clinicial judgement Functional Limitations: Swallowing Swallow Current Status (U9811): At least 1 percent but less than 20 percent impaired, limited or restricted Swallow Goal Status [phone]): At least 1 percent but less than 20 percent impaired, limited or restricted Swallow Discharge Status [phone]): At least 1 percent but less than 20 percent impaired, limited or restricted     Jerilynn Som, MS, CCC-SLP Watson,Katherine 05/10/2016,11:58 AM

## 2016-05-10 NOTE — Plan of Care (Signed)
Problem: Education: Goal: Knowledge of [facility] Education information/materials will improve Outcome: Progressing Pt likes to be called Fayrene Fearing  Past Medical History:  Diagnosis Date  . Diabetes mellitus without complication (HCC)   . Hypertension   . MI (myocardial infarction)   . Stroke (cerebrum) (HCC)    Pt is well controlled with home medications

## 2016-05-10 NOTE — Evaluation (Signed)
Physical Therapy Evaluation Patient Details Name: Stephen [name] MRN: [id number] DOB: August 09, 1926 Today's Date: 05/10/2016   History of Present Illness  83 yo male with onset of dysarthria, acute L posterior frontal cortex strokes, but has PMHx:  B cerebellar stroke, thalamic and basal ganglia strokes, stroke of corpus callosum, DM, MI   Clinical Impression  Pt was demonstrating some lack of safety awareness today, initially reluctant to use RW. After talking with him about PT concerns he agreed to walk with it and then could see more of the issues that PT did regarding RLE placement and control of standing balance.  He is appropriate for home but will need family to assist him with HHPT to follow up with him.  Follow acutely to make sure he continues to work on standing balance/gait control and asked nursing to continue on with him for walks to assist him for endurance and safety.    Follow Up Recommendations Home health PT;Supervision for mobility/OOB    Equipment Recommendations  Rolling walker with 5" wheels    Recommendations for Other Services       Precautions / Restrictions Precautions Precautions: Fall (telemetry, impulsivity) Restrictions Weight Bearing Restrictions: No      Mobility  Bed Mobility Overal bed mobility: Needs Assistance Bed Mobility: Supine to Sit     Supine to sit: Min guard;Min assist     General bed mobility comments: pt is somewhat impulsive and cannot sequence without some cues as well as help to shift to side of bed  Transfers Overall transfer level: Needs assistance Equipment used: Rolling walker (2 wheeled);1 person hand held assist Transfers: Sit to/from [employer] Sit to Stand: Min guard;Min assist Stand pivot transfers: Min guard;Min assist       General transfer comment: pt has unsteadiness on RLE and does not attend to this until later in gait  Ambulation/Gait Ambulation/Gait assistance: Min assist (to control RLE  missteps) Ambulation Distance (Feet): 45 Feet Assistive device: Rolling walker (2 wheeled);1 person hand held assist Gait Pattern/deviations: Step-through pattern;Step-to pattern;Ataxic;Decreased stride length;Trunk flexed;Wide base of support;Narrow base of support Gait velocity: reduced Gait velocity interpretation: Below normal speed for age/gender General Gait Details: difficulty controlling placement of RLE and has crossover and missteps on RLE but may be baseline from cerebellar stroke  Stairs            Wheelchair Mobility    Modified Rankin (Stroke Patients Only)       Balance Overall balance assessment: Needs assistance Sitting-balance support: Feet supported Sitting balance-Leahy Scale: Fair       Standing balance-Leahy Scale: Fair Standing balance comment: poor dynamic standing balance                             Pertinent Vitals/Pain Pain Assessment: Faces Faces Pain Scale: Hurts little more Pain Location: back with mobility Pain Descriptors / Indicators: Aching (old back surgery) Pain Intervention(s): Limited activity within patient's tolerance;Repositioned    Home Living Family/patient expects to be discharged to:: Private residence Living Arrangements: Spouse/significant other Available Help at Discharge: Family;Available 24 hours/day Type of Home: House Home Access: Stairs to enter Entrance Stairs-Rails: Right;Left;Can reach both;None (each step) Entrance Stairs-Number of Steps: 1+1 with railing on one step Home Layout: One level Home Equipment: Walker - 4 wheels;Cane - single point Additional Comments: needs a regular RW    Prior Function Level of Independence: Independent with assistive device(s);Needs assistance   Gait / Transfers Assistance  Needed: SPC but not safe per family  ADL's / Homemaking Assistance Needed: wife is his caregiver        Hand Dominance   Dominant Hand: Right (R has oscillating tremors)     Extremity/Trunk Assessment   Upper Extremity Assessment Upper Extremity Assessment: Overall WFL for tasks assessed    Lower Extremity Assessment Lower Extremity Assessment: RLE deficits/detail RLE Deficits / Details: coordination deficits on RLE RLE Coordination: decreased fine motor;decreased gross motor    Cervical / Trunk Assessment Cervical / Trunk Assessment: Normal  Communication   Communication: No difficulties  Cognition Arousal/Alertness: Awake/alert Behavior During Therapy: Impulsive Overall Cognitive Status: History of cognitive impairments - at baseline                                        General Comments General comments (skin integrity, edema, etc.): pt is demonstrating some difficulty with controlling steps and turns, but is cuable and with gait belt is manageable for safety    Exercises     Assessment/Plan    PT Assessment Patient needs continued PT services  PT Problem List Decreased range of motion;Decreased activity tolerance;Decreased balance;Decreased mobility;Decreased coordination;Decreased knowledge of use of DME;Decreased safety awareness;Decreased knowledge of precautions       PT Treatment Interventions DME instruction;Gait training;Functional mobility training;Therapeutic activities;Therapeutic exercise;Balance training;Neuromuscular re-education;Stair training;Patient/family education    PT Goals (Current goals can be found in the Care Plan section)  Acute Rehab PT Goals Patient Stated Goal: walk alone and get home PT Goal Formulation: With patient/family Time For Goal Achievement: 05/24/16 Potential to Achieve Goals: Good    Frequency 7X/week   Barriers to discharge Other (comment) (family will need HHPT to support his recovery)      Co-evaluation               End of Session Equipment Utilized During Treatment: Gait belt Activity Tolerance: Patient tolerated treatment well;Other (comment) (impulsivity was  better after pt saw his control of RLE) Patient left: in chair;with call bell/phone within reach;with chair alarm set;with family/visitor present Nurse Communication: Mobility status PT Visit Diagnosis: Other abnormalities of gait and mobility (R26.89);Unsteadiness on feet (R26.81)    Time: 4098-1191 PT Time Calculation (min) (ACUTE ONLY): 31 min   Charges:   PT Evaluation $PT Eval Moderate Complexity: 1 Procedure PT Treatments $Gait Training: 8-22 mins   PT G Codes:   PT G-Codes **NOT FOR INPATIENT CLASS** Functional Assessment Tool Used: AM-PAC 6 Clicks Basic Mobility;Clinical judgement Functional Limitation: Mobility: Walking and moving around Mobility: Walking and Moving Around Current Status (Y7829): At least 20 percent but less than 40 percent impaired, limited or restricted Mobility: Walking and Moving Around Goal Status [id number]): At least 1 percent but less than 20 percent impaired, limited or restricted     Ivar Drape 05/10/2016, 12:59 PM   Samul Dada, PT MS Acute Rehab Dept. Number: [facility] [id number] and [facility] [phone]

## 2016-05-10 NOTE — Care Management (Signed)
Admitted to this facility under observation status with the diagnosis of dysarthia. Lives with wife, Thelma Barge [phone]). Last seen Dr. Darreld Mclean at [facility] 4 months ago. No home health. [facility] in 2013. No home oxygen. Uses a cane to aid in ambulation. Uses [employer] for medications. No falls. Good appetite. Takes care of all basic activities of daily living himself, doesn't drive. Family will transport. Gwenette Greet RN MSN CCM Care Management

## 2016-05-10 NOTE — Care Management Obs Status (Signed)
MEDICARE OBSERVATION STATUS NOTIFICATION   Patient Details  Name: Stephen [name] MRN: [id number] Date of Birth: 1926-03-10   Medicare Observation Status Notification Given:  Yes    Gwenette Greet, RN 05/10/2016, 11:44 AM

## 2016-05-10 NOTE — Care Management (Signed)
Informed that patient for discharge hoe today and in need of physical therapy, social work and walker. [facility] able to accept patient's medicare uhc .  Informed family and primary nurse.

## 2016-05-10 NOTE — Progress Notes (Signed)
[facility] [place] [facility]   PATIENT NAME: Stephen [name]    MR#:  [id number]  DATE OF BIRTH:  06/29/1926  SUBJECTIVE:  CHIEF COMPLAINT:  Patient has dysarthria  REVIEW OF SYSTEMS:  CONSTITUTIONAL: No fever, fatigue or weakness.  EYES: No blurred or double vision.  EARS, NOSE, AND THROAT: No tinnitus or ear pain.  RESPIRATORY: No cough, shortness of breath, wheezing or hemoptysis.  CARDIOVASCULAR: No chest pain, orthopnea, edema.  GASTROINTESTINAL: No nausea, vomiting, diarrhea or abdominal pain.  GENITOURINARY: No dysuria, hematuria.  ENDOCRINE: No polyuria, nocturia,  HEMATOLOGY: No anemia, easy bruising or bleeding SKIN: No rash or lesion. MUSCULOSKELETAL: No joint pain or arthritis.   NEUROLOGIC:Reporting word finding difficulty No tingling, numbness, weakness.  PSYCHIATRY: No anxiety or depression.   DRUG ALLERGIES:  No Known Allergies  VITALS:  Blood pressure (!) 153/75, pulse (!) 58, temperature 98.1 F (36.7 C), temperature source Oral, resp. rate 18, height 6' (1.829 m), weight 90.7 kg (200 lb), SpO2 99 %.  PHYSICAL EXAMINATION:  GENERAL:  83 y.o.-year-old patient lying in the bed with no acute distress.  EYES: Pupils equal, round, reactive to light and accommodation. No scleral icterus. Extraocular muscles intact.  HEENT: Head atraumatic, normocephalic. Oropharynx and nasopharynx clear.  NECK:  Supple, no jugular venous distention. No thyroid enlargement, no tenderness.  LUNGS: Normal breath sounds bilaterally, no wheezing, rales,rhonchi or crepitation. No use of accessory muscles of respiration.  CARDIOVASCULAR: S1, S2 normal. No murmurs, rubs, or gallops.  ABDOMEN: Soft, nontender, nondistended. Bowel sounds present. No organomegaly or mass.  EXTREMITIES: No pedal edema, cyanosis, or clubbing.  NEUROLOGIC: Cranial nerves II through XII are intact. Muscle strength 5/5 in all extremities. Sensation intact. Gait not checked. Has  dysarthria PSYCHIATRIC: The patient is alert and oriented x 3.  SKIN: No obvious rash, lesion, or ulcer.    LABORATORY PANEL:   CBC  Recent Labs Lab 05/09/16 1834  WBC 3.8  HGB 13.6  HCT 41.5  PLT 184   ------------------------------------------------------------------------------------------------------------------  Chemistries   Recent Labs Lab 05/09/16 1834  NA 135  K 3.3*  CL 102  CO2 26  GLUCOSE 130*  BUN 14  CREATININE 0.99  CALCIUM 8.8*  AST 22  ALT 13*  ALKPHOS 81  BILITOT 0.4   ------------------------------------------------------------------------------------------------------------------  Cardiac Enzymes  Recent Labs Lab 05/09/16 1834  TROPONINI <0.03   ------------------------------------------------------------------------------------------------------------------  RADIOLOGY:  Mr Maxine Glenn Head Wo Contrast  Result Date: 05/10/2016 CLINICAL DATA:  Dementia.  Acute presentation with slurred speech. EXAM: MRI HEAD WITHOUT CONTRAST MRA HEAD WITHOUT CONTRAST TECHNIQUE: Multiplanar, multiecho pulse sequences of the brain and surrounding structures were obtained without intravenous contrast. Angiographic images of the head were obtained using MRA technique without contrast. COMPARISON:  CT 05/09/2016.  MRI 07/16/2006. FINDINGS: MRI HEAD FINDINGS Brain: Diffusion imaging shows a 7 mm at acute infarction at the left posterior frontal cortex and a second 7 mm acute infarction more towards the vertex in the left posterior frontal cortex. These are consistent with micro embolic infarctions. No other acute infarction. There chronic small-vessel ischemic changes affecting the pons. There is old cerebellar infarction on the right. Few old small vessel left cerebellar infarctions. Chronic small-vessel ischemic changes are seen affecting the thalami, basal ganglia and hemispheric white matter. Old corpus callosum infarction. No mass lesion, hemorrhage, hydrocephalus or  extra-axial collection. Vascular: Flow is present in both internal carotid artery's an the left vertebral artery and basilar artery. No flow seen in a right vertebral.  Skull and upper cervical spine: Negative Sinuses/Orbits: Clear/normal Other: None significant MRA HEAD FINDINGS Both internal carotid arteries are patent through the skullbase. There is atherosclerotic change in both carotid siphon regions but without flow limiting stenosis. 30-50% stenosis of the supraclinoid ICA on the right. Right ICA supplies only the right middle cerebral artery territory. Left ICA supplies the left middle cerebral artery territory and both anterior cerebral artery territories. No antegrade flow seen in the right vertebral artery. Left vertebral artery is widely patent to the basilar. Patent bilateral anterior inferior cerebellar arteries. No basilar stenosis. Superior cerebellar and posterior cerebral arteries are patent, right PCA receiving it supply from the anterior circulation. Minimal contribution from the basilar tip. Left PCA receives most of it supply from the posterior circulation, though there is a large posterior communicating artery on the left. More distal intracranial branch vessels do show atherosclerotic narrowing and irregularity. IMPRESSION: Two subcentimeter acute infarctions in the left MCA territory affecting the posterior frontal cortex, consistent with acute embolic infarctions. No evidence of swelling or hemorrhage. Extensive chronic small vessel ischemic changes throughout the brain. Extensive old right cerebellar infarction. Chronic right vertebral artery occlusion. 30-50% stenosis of the supraclinoid ICA on the right. Distal vessel atherosclerotic irregularity diffusely. 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 Right PCA receives its supply from the anterior circulation. Left PCA supply is mixed. Electronically Signed   By: Paulina Fusi M.D.   On: 05/10/2016 10:04   Mr Brain Wo Contrast  Result  Date: 05/10/2016 CLINICAL DATA:  Dementia.  Acute presentation with slurred speech. EXAM: MRI HEAD WITHOUT CONTRAST MRA HEAD WITHOUT CONTRAST TECHNIQUE: Multiplanar, multiecho pulse sequences of the brain and surrounding structures were obtained without intravenous contrast. Angiographic images of the head were obtained using MRA technique without contrast. COMPARISON:  CT 05/09/2016.  MRI 07/16/2006. FINDINGS: MRI HEAD FINDINGS Brain: Diffusion imaging shows a 7 mm at acute infarction at the left posterior frontal cortex and a second 7 mm acute infarction more towards the vertex in the left posterior frontal cortex. These are consistent with micro embolic infarctions. No other acute infarction. There chronic small-vessel ischemic changes affecting the pons. There is old cerebellar infarction on the right. Few old small vessel left cerebellar infarctions. Chronic small-vessel ischemic changes are seen affecting the thalami, basal ganglia and hemispheric white matter. Old corpus callosum infarction. No mass lesion, hemorrhage, hydrocephalus or extra-axial collection. Vascular: Flow is present in both internal carotid artery's an the left vertebral artery and basilar artery. No flow seen in a right vertebral. Skull and upper cervical spine: Negative Sinuses/Orbits: Clear/normal Other: None significant MRA HEAD FINDINGS Both internal carotid arteries are patent through the skullbase. There is atherosclerotic change in both carotid siphon regions but without flow limiting stenosis. 30-50% stenosis of the supraclinoid ICA on the right. Right ICA supplies only the right middle cerebral artery territory. Left ICA supplies the left middle cerebral artery territory and both anterior cerebral artery territories. No antegrade flow seen in the right vertebral artery. Left vertebral artery is widely patent to the basilar. Patent bilateral anterior inferior cerebellar arteries. No basilar stenosis. Superior cerebellar and  posterior cerebral arteries are patent, right PCA receiving it supply from the anterior circulation. Minimal contribution from the basilar tip. Left PCA receives most of it supply from the posterior circulation, though there is a large posterior communicating artery on the left. More distal intracranial branch vessels do show atherosclerotic narrowing and irregularity. IMPRESSION: Two subcentimeter acute infarctions in the left MCA territory  affecting the posterior frontal cortex, consistent with acute embolic infarctions. No evidence of swelling or hemorrhage. Extensive chronic small vessel ischemic changes throughout the brain. Extensive old right cerebellar infarction. Chronic right vertebral artery occlusion. 30-50% stenosis of the supraclinoid ICA on the right. Distal vessel atherosclerotic irregularity diffusely. 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 Right PCA receives its supply from the anterior circulation. Left PCA supply is mixed. Electronically Signed   By: Paulina Fusi M.D.   On: 05/10/2016 10:04   US Carotid Bilateral  Result Date: 05/10/2016 CLINICAL DATA:  Slurred speech. EXAM: BILATERAL CAROTID DUPLEX ULTRASOUND TECHNIQUE: Wallace Cullens scale imaging, color Doppler and duplex ultrasound were performed of bilateral carotid and vertebral arteries in the neck. COMPARISON:  MRI 05/10/2016.  Ultrasound 07/16/2006. FINDINGS: Criteria: Quantification of carotid stenosis is based on velocity parameters that correlate the residual internal carotid diameter with NASCET-based stenosis levels, using the diameter of the distal internal carotid lumen as the denominator for stenosis measurement. The following velocity measurements were obtained: RIGHT ICA:  72/16 cm/sec CCA:  116/12 cm/sec SYSTOLIC ICA/CCA RATIO:  0.6 DIASTOLIC ICA/CCA RATIO:  1.4 ECA:  85 cm/sec LEFT ICA:  60/16 cm/sec CCA:  86/13 cm/sec SYSTOLIC ICA/CCA RATIO:  0.7 DIASTOLIC ICA/CCA RATIO:  1.2 ECA:  89 cm/sec RIGHT CAROTID ARTERY: Diffuse mild  right carotid intimal thickening/ atherosclerotic vascular disease. No flow limiting stenosis. RIGHT VERTEBRAL ARTERY:  Not visualized. LEFT CAROTID ARTERY: Diffuse left common carotid intimal thickening/ atherosclerotic vascular disease. No flow limiting stenosis. LEFT VERTEBRAL ARTERY:  Patent with antegrade flow. IMPRESSION: 1. Diffuse mild bilateral carotid intimal thickening/ atherosclerotic vascular disease. No flow limiting stenosis. Degree of stenosis less than 50% bilaterally. 2. Right vertebral not visualized. Left vertebral is patent with antegrade flow. Electronically Signed   By: Maisie Fus  Register   On: 05/10/2016 11:11   Ct Head Code Stroke W/o Cm  Result Date: 05/09/2016 CLINICAL DATA:  Code stroke. Slurred speech. Word-finding difficulties. Assess for stroke. History of hypertension, diabetes and stroke. EXAM: CT HEAD WITHOUT CONTRAST TECHNIQUE: Contiguous axial images were obtained from the base of the skull through the vertex without intravenous contrast. COMPARISON:  MRI of the head July 16, 2006 FINDINGS: BRAIN: No intraparenchymal hemorrhage, mass effect nor midline shift. Old large RIGHT superior cerebellar infarct with ex vacuo dilatation fourth ventricle, RIGHT brachium pontis atrophy. Confluent supratentorial white matter hypodensities. No acute large vascular territory infarcts. Similarly prominent bifrontal extra-axial fluid spaces. 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 Basal cisterns are patent. VASCULAR: Moderate calcific atherosclerosis of the carotid siphons with dolichoectasia compatible chronic hypertension. SKULL: No skull fracture. Small LEFT frontal scalp hematoma or mass, recommend direct inspection. LEFT suboccipital scalp lipoma. SINUSES/ORBITS: The mastoid air-cells and included paranasal sinuses are well-aerated.The included ocular globes and orbital contents are non-suspicious. OTHER: None. ASPECTS [facility] Stroke Program Early CT Score) - Ganglionic level infarction (caudate, lentiform  nuclei, internal capsule, insula, M1-M3 cortex): 7 - Supraganglionic infarction (M4-M6 cortex): 3 Total score (0-10 with 10 being normal): 10 IMPRESSION: 1. No acute intracranial process. 2. ASPECTS is 10. 3. 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 Severe chronic small vessel ischemic disease. Critical Value/emergent results were called by telephone at the time of interpretation on 05/09/2016 at 6:52 pm to Dr. Minna Antis , who verbally acknowledged these results. Electronically Signed   By: Awilda Metro M.D.   On: 05/09/2016 18:53    EKG:   Orders placed or performed during the hospital encounter of 05/09/16  . ED EKG  . ED EKG  ASSESSMENT AND PLAN:  HPI JamesGravesis a 83 y.o.malewith a known history of CVA, hypertension, diabetes, mild dementia presents to the emergency room with acute onset of slurred speech at 2 PM today. By the time he arrived to the emergency room symptoms are improving. At this time his speech is close to normal with minimal slurred speech as per family. Patient is a poor historian. Does have memory issues at baseline. He was confused per family previously and is slowly improving. Most of the history obtained by reviewing old records and discussing with family at bedside. He does have chronic right-sided weakness from prior stroke which is unchanged. No trouble swallowing. No change in vision. CT scan of the head showed old strokes but nothing acute.  * AcuteCVA in the left MCA territory-embolic CT scan of the head showed nothing acute. -MRI brain with 2 acute subcentimeter infarcts in the left MCA territory embolic -Echocardiogram -Questionable vegetation according to Dr. Okey Dupre, nothing by mouth after midnight for possible TEE tomorrow, blood cultures 2 ordered follow-up with cardiology, discussed with family members -Carotid Dopplers less than 50% stenosis bilaterally -Treated with Aggrenox during the hospital course but will  discharge patient with aspirin  81 mg and Plavix 75 mg and statin as reported by neurology - Lovenox for DVT prophylaxis. -  patient passed bedside swallow evaluation and tolerating diet -Home health PT and rolling walker with 5 inch wheels -- Appreciate neurology recommendations. Outpatient follow-up with neurology in one month -LDL 104, TSH normal  * Hypertension. Amlodipine and lisinopril.  * Diabetes mellitus type 2. On metformin.   * Mild dementia. Watch for inpatient delirium.  * DVT prophylaxis with Lovenox.        All the records are reviewed and case discussed with Care Management/Social Workerr. Management plans discussed with the patient, family and they are in agreement.  CODE STATUS: fc   TOTAL TIME TAKING CARE OF THIS PATIENT:  39  minutes.   POSSIBLE D/C IN 1-2 DAYS, DEPENDING ON CLINICAL CONDITION.  Note: This dictation was prepared with Dragon dictation along with smaller phrase technology. Any transcriptional errors that result from this process are unintentional.   Ramonita Lab M.D on 05/10/2016 at 5:12 PM  Between 7am to 6pm - Pager - [phone] After 6pm go to [email or web address] - password [email or web address] [facility]  [place] [facility] [facility]  Office  [phone]  CC: Primary care physician; [facility]

## 2016-05-10 NOTE — Consult Note (Signed)
Referring Physician: Gouru    Chief Complaint: Difficulty with speech  HPI: Stephen [name] is an 83 y.o. male with a history of stroke on Aggrenox who allows his family to provide much of the history.  It appears that yesterday afternoon the patient began to have difficulty with speech.  Initially it was slurred but them developed to the point that it seemed he knew what he wanted to say but could not get the words out.  Symptoms did not improve until overnight.  Family feels he is now at baseline.  Initial NIHSS of 3.    Date last known well: Date: 05/09/2016 Time last known well: Time: 16:00 tPA Given: No: Resolving symptoms  Past Medical History:  Diagnosis Date  . Diabetes mellitus without complication (HCC)   . Hypertension   . MI (myocardial infarction)   . Stroke (cerebrum) [facility])     History reviewed. No pertinent surgical history.  Family History  Problem Relation Age of Onset  . Hypertension Other    Social History:  reports that he has quit smoking. He has never used smokeless tobacco. He reports that he does not drink alcohol or use drugs.  Allergies: No Known Allergies  Medications:  I have reviewed the patient's current medications. Prior to Admission:  Prescriptions Prior to Admission  Medication Sig Dispense Refill Last Dose  . amLODipine (NORVASC) 10 MG tablet Take 10 mg by mouth daily.   05/09/2016 at am  . atorvastatin (LIPITOR) 40 MG tablet Take 40 mg by mouth daily.   05/08/2016 at pm  . dipyridamole-aspirin (AGGRENOX) 200-25 MG 12hr capsule Take 1 capsule by mouth daily.   05/09/2016 at am  . ferrous sulfate 325 (65 FE) MG tablet Take 325 mg by mouth daily with breakfast.   05/09/2016 at am  . IBU 800 MG tablet Take 800 mg by mouth 2 (two) times daily as needed.    prn at prn  . lisinopril (PRINIVIL,ZESTRIL) 20 MG tablet Take 20 mg by mouth 2 (two) times daily.    05/09/2016 at am  . metFORMIN (GLUCOPHAGE) 500 MG tablet Take 500 mg by mouth daily.   05/09/2016 at am    Scheduled: . amLODipine  10 mg Oral Daily  . atorvastatin  40 mg Oral Daily  . dipyridamole-aspirin  1 capsule Oral Daily  . docusate sodium  100 mg Oral BID  . enoxaparin (LOVENOX) injection  40 mg Subcutaneous Q24H  . ferrous sulfate  325 mg Oral Q breakfast  . insulin aspart  0-9 Units Subcutaneous TID WC  . lisinopril  20 mg Oral BID  . metFORMIN  500 mg Oral Q breakfast  . sodium chloride flush  3 mL Intravenous Q12H    ROS: History obtained from the patient  General ROS: negative for - chills, fatigue, fever, night sweats, weight gain or weight loss Psychological ROS: negative for - behavioral disorder, hallucinations, memory difficulties, mood swings or suicidal ideation Ophthalmic ROS: negative for - blurry vision, double vision, eye pain or loss of vision ENT ROS: negative for - epistaxis, nasal discharge, oral lesions, sore throat, tinnitus or vertigo Allergy and Immunology ROS: negative for - hives or itchy/watery eyes Hematological and Lymphatic ROS: negative for - bleeding problems, bruising or swollen lymph nodes Endocrine ROS: negative for - galactorrhea, hair pattern changes, polydipsia/polyuria or temperature intolerance Respiratory ROS: negative for - cough, hemoptysis, shortness of breath or wheezing Cardiovascular ROS: negative for - chest pain, dyspnea on exertion, edema or irregular heartbeat Gastrointestinal  ROS: negative for - abdominal pain, diarrhea, hematemesis, nausea/vomiting or stool incontinence Genito-Urinary ROS: negative for - dysuria, hematuria, incontinence or urinary frequency/urgency Musculoskeletal ROS: negative for - joint swelling or muscular weakness Neurological ROS: as noted in HPI Dermatological ROS: negative for rash and skin lesion changes  Physical Examination: Blood pressure (!) 153/75, pulse (!) 58, temperature 98.1 F (36.7 C), temperature source Oral, resp. rate 18, height 6' (1.829 m), weight 90.7 kg (200 lb), SpO2 99  %.  HEENT-  Normocephalic, no lesions, without obvious abnormality.  Normal external eye and conjunctiva.  Normal TM's bilaterally.  Normal auditory canals and external ears. Normal external nose, mucus membranes and septum.  Normal pharynx. Cardiovascular- S1, S2 normal, pulses palpable throughout   Lungs- chest clear, no wheezing, rales, normal symmetric air entry Abdomen- soft, non-tender; bowel sounds normal; no masses,  no organomegaly Extremities- no edema Lymph-no adenopathy palpable Musculoskeletal-no joint tenderness, deformity or swelling Skin-warm and dry, no hyperpigmentation, vitiligo, or suspicious lesions  Neurological Examination   Mental Status: Alert, oriented, thought content appropriate.  Some word finding deficits noted.  Able to follow 3 step commands without difficulty. Cranial Nerves: II: Discs flat bilaterally; Visual fields grossly normal, pupils equal, round, reactive to light and accommodation III,IV, VI: ptosis not present, extra-ocular motions intact bilaterally V,VII: smile symmetric, facial light touch sensation normal bilaterally VIII: hearing normal bilaterally IX,X: gag reflex present XI: bilateral shoulder shrug XII: midline tongue extension Motor: Right : Upper extremity   5/5    Left:     Upper extremity   5-/5  Lower extremity   5/5     Lower extremity   5/5 Tone and bulk:normal tone throughout; no atrophy noted Sensory: Pinprick and light touch decreased in the LUE and LLE Deep Tendon Reflexes: 2+ in the upper extremities and absent in the lower extremities Plantars: Right: mute   Left: mute Cerebellar: Normal finger-to-nose and normal heel-to-shin testing bilaterally Gait: not tested due to safety concerns    Laboratory Studies:  Basic Metabolic Panel:  Recent Labs Lab 05/09/16 1834  NA 135  K 3.3*  CL 102  CO2 26  GLUCOSE 130*  BUN 14  CREATININE 0.99  CALCIUM 8.8*    Liver Function Tests:  Recent Labs Lab 05/09/16 1834   AST 22  ALT 13*  ALKPHOS 81  BILITOT 0.4  PROT 7.0  ALBUMIN 3.9   No results for input(s): LIPASE, AMYLASE in the last 168 hours. No results for input(s): AMMONIA in the last 168 hours.  CBC:  Recent Labs Lab 05/09/16 1834  WBC 3.8  NEUTROABS 2.2  HGB 13.6  HCT 41.5  MCV 88.2  PLT 184    Cardiac Enzymes:  Recent Labs Lab 05/09/16 1834  TROPONINI <0.03    BNP: Invalid input(s): POCBNP  CBG:  Recent Labs Lab 05/09/16 1814 05/09/16 2246 05/10/16 0730 05/10/16 1200  GLUCAP 143* 93 85 123*    Microbiology: Results for orders placed or performed in visit on 07/24/12  Wound culture     Status: None   Collection Time: 07/31/12  8:39 AM  Result Value Ref Range Status   Micro Text Report   Final       SOURCE: LEFT ELBOW    ORGANISM 1                MODERATE GROWTH STAPHYLOCOCCUS AUREUS   ORGANISM 2                MODERATE GROWTH STAPHYLOCOCCUS AUREUS  ORGANISM 3                LIGHT GROWTH STAPHYLOCOCCUS EPIDERMIDIS   COMMENT                   ORGANISM #1 AND #2 HAVE DIFFERENT SENSITIVITIES   GRAM STAIN                FEW WHITE BLOOD CELLS   GRAM STAIN                NO ORGANISMS SEEN   ANTIBIOTIC                    ORG#1    ORG#2    ORG#3     CIPROFLOXACIN                 S        S        S         CLINDAMYCIN                   S        S        R         ERYTHROMYCIN                  S        R        R         GENTAMICIN                    S        S        S         LEVOFLOXACIN                  S        S        S         LINEZOLID                     S        S        S         OXACILLIN                     S        S        R         TIGECYCLINE                   S        S                  CEFOXITIN SCREEN              NEGATIVE NEGATIVE           IND UCIBLE CLINDAMYCIN RESISTANNEGATIVE NEGATIVE           TRIMETHOPRIM/SULFAMETHOXAZOLE S        S                  TETRACYCLINE                                    R         VANCOMYCIN  S             Coagulation Studies:  Recent Labs  05/09/16 1834  LABPROT 12.0  INR 0.89    Urinalysis:  Recent Labs Lab 05/09/16 1939  COLORURINE YELLOW*  LABSPEC 1.016  PHURINE 6.0  GLUCOSEU 150*  HGBUR NEGATIVE  BILIRUBINUR NEGATIVE  KETONESUR NEGATIVE  PROTEINUR NEGATIVE  NITRITE NEGATIVE  LEUKOCYTESUR NEGATIVE    Lipid Panel:    Component Value Date/Time   CHOL 176 05/10/2016 0351   TRIG 77 05/10/2016 0351   HDL 57 05/10/2016 0351   CHOLHDL 3.1 05/10/2016 0351   VLDL 15 05/10/2016 0351   LDLCALC 104 (H) 05/10/2016 0351    HgbA1C: No results found for: HGBA1C  Urine Drug Screen:  No results found for: LABOPIA, COCAINSCRNUR, LABBENZ, AMPHETMU, THCU, LABBARB  Alcohol Level: No results for input(s): ETH in the last 168 hours.   Imaging: Mr Shirlee Latch RU Contrast  Result Date: 05/10/2016 CLINICAL DATA:  Dementia.  Acute presentation with slurred speech. EXAM: MRI HEAD WITHOUT CONTRAST MRA HEAD WITHOUT CONTRAST TECHNIQUE: Multiplanar, multiecho pulse sequences of the brain and surrounding structures were obtained without intravenous contrast. Angiographic images of the head were obtained using MRA technique without contrast. COMPARISON:  CT 05/09/2016.  MRI 07/16/2006. FINDINGS: MRI HEAD FINDINGS Brain: Diffusion imaging shows a 7 mm at acute infarction at the left posterior frontal cortex and a second 7 mm acute infarction more towards the vertex in the left posterior frontal cortex. These are consistent with micro embolic infarctions. No other acute infarction. There chronic small-vessel ischemic changes affecting the pons. There is old cerebellar infarction on the right. Few old small vessel left cerebellar infarctions. Chronic small-vessel ischemic changes are seen affecting the thalami, basal ganglia and hemispheric white matter. Old corpus callosum infarction. No mass lesion, hemorrhage, hydrocephalus or extra-axial collection. Vascular: Flow  is present in both internal carotid artery's an the left vertebral artery and basilar artery. No flow seen in a right vertebral. Skull and upper cervical spine: Negative Sinuses/Orbits: Clear/normal Other: None significant MRA HEAD FINDINGS Both internal carotid arteries are patent through the skullbase. There is atherosclerotic change in both carotid siphon regions but without flow limiting stenosis. 30-50% stenosis of the supraclinoid ICA on the right. Right ICA supplies only the right middle cerebral artery territory. Left ICA supplies the left middle cerebral artery territory and both anterior cerebral artery territories. No antegrade flow seen in the right vertebral artery. Left vertebral artery is widely patent to the basilar. Patent bilateral anterior inferior cerebellar arteries. No basilar stenosis. Superior cerebellar and posterior cerebral arteries are patent, right PCA receiving it supply from the anterior circulation. Minimal contribution from the basilar tip. Left PCA receives most of it supply from the posterior circulation, though there is a large posterior communicating artery on the left. More distal intracranial branch vessels do show atherosclerotic narrowing and irregularity. IMPRESSION: Two subcentimeter acute infarctions in the left MCA territory affecting the posterior frontal cortex, consistent with acute embolic infarctions. No evidence of swelling or hemorrhage. Extensive chronic small vessel ischemic changes throughout the brain. Extensive old right cerebellar infarction. Chronic right vertebral artery occlusion. 30-50% stenosis of the supraclinoid ICA on the right. Distal vessel atherosclerotic irregularity diffusely. 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 Right PCA receives its supply from the anterior circulation. Left PCA supply is mixed. Electronically Signed   By: Paulina Fusi M.D.   On: 05/10/2016 10:04   Mr Brain Wo Contrast  Result Date: 05/10/2016 CLINICAL  DATA:   Dementia.  Acute presentation with slurred speech. EXAM: MRI HEAD WITHOUT CONTRAST MRA HEAD WITHOUT CONTRAST TECHNIQUE: Multiplanar, multiecho pulse sequences of the brain and surrounding structures were obtained without intravenous contrast. Angiographic images of the head were obtained using MRA technique without contrast. COMPARISON:  CT 05/09/2016.  MRI 07/16/2006. FINDINGS: MRI HEAD FINDINGS Brain: Diffusion imaging shows a 7 mm at acute infarction at the left posterior frontal cortex and a second 7 mm acute infarction more towards the vertex in the left posterior frontal cortex. These are consistent with micro embolic infarctions. No other acute infarction. There chronic small-vessel ischemic changes affecting the pons. There is old cerebellar infarction on the right. Few old small vessel left cerebellar infarctions. Chronic small-vessel ischemic changes are seen affecting the thalami, basal ganglia and hemispheric white matter. Old corpus callosum infarction. No mass lesion, hemorrhage, hydrocephalus or extra-axial collection. Vascular: Flow is present in both internal carotid artery's an the left vertebral artery and basilar artery. No flow seen in a right vertebral. Skull and upper cervical spine: Negative Sinuses/Orbits: Clear/normal Other: None significant MRA HEAD FINDINGS Both internal carotid arteries are patent through the skullbase. There is atherosclerotic change in both carotid siphon regions but without flow limiting stenosis. 30-50% stenosis of the supraclinoid ICA on the right. Right ICA supplies only the right middle cerebral artery territory. Left ICA supplies the left middle cerebral artery territory and both anterior cerebral artery territories. No antegrade flow seen in the right vertebral artery. Left vertebral artery is widely patent to the basilar. Patent bilateral anterior inferior cerebellar arteries. No basilar stenosis. Superior cerebellar and posterior cerebral arteries are patent,  right PCA receiving it supply from the anterior circulation. Minimal contribution from the basilar tip. Left PCA receives most of it supply from the posterior circulation, though there is a large posterior communicating artery on the left. More distal intracranial branch vessels do show atherosclerotic narrowing and irregularity. IMPRESSION: Two subcentimeter acute infarctions in the left MCA territory affecting the posterior frontal cortex, consistent with acute embolic infarctions. No evidence of swelling or hemorrhage. Extensive chronic small vessel ischemic changes throughout the brain. Extensive old right cerebellar infarction. Chronic right vertebral artery occlusion. 30-50% stenosis of the supraclinoid ICA on the right. Distal vessel atherosclerotic irregularity diffusely. 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 Right PCA receives its supply from the anterior circulation. Left PCA supply is mixed. Electronically Signed   By: Paulina Fusi M.D.   On: 05/10/2016 10:04   US Carotid Bilateral  Result Date: 05/10/2016 CLINICAL DATA:  Slurred speech. EXAM: BILATERAL CAROTID DUPLEX ULTRASOUND TECHNIQUE: Wallace Cullens scale imaging, color Doppler and duplex ultrasound were performed of bilateral carotid and vertebral arteries in the neck. COMPARISON:  MRI 05/10/2016.  Ultrasound 07/16/2006. FINDINGS: Criteria: Quantification of carotid stenosis is based on velocity parameters that correlate the residual internal carotid diameter with NASCET-based stenosis levels, using the diameter of the distal internal carotid lumen as the denominator for stenosis measurement. The following velocity measurements were obtained: RIGHT ICA:  72/16 cm/sec CCA:  116/12 cm/sec SYSTOLIC ICA/CCA RATIO:  0.6 DIASTOLIC ICA/CCA RATIO:  1.4 ECA:  85 cm/sec LEFT ICA:  60/16 cm/sec CCA:  86/13 cm/sec SYSTOLIC ICA/CCA RATIO:  0.7 DIASTOLIC ICA/CCA RATIO:  1.2 ECA:  89 cm/sec RIGHT CAROTID ARTERY: Diffuse mild right carotid intimal thickening/  atherosclerotic vascular disease. No flow limiting stenosis. RIGHT VERTEBRAL ARTERY:  Not visualized. LEFT CAROTID ARTERY: Diffuse left common carotid intimal thickening/ atherosclerotic vascular disease. No flow limiting stenosis. LEFT  VERTEBRAL ARTERY:  Patent with antegrade flow. IMPRESSION: 1. Diffuse mild bilateral carotid intimal thickening/ atherosclerotic vascular disease. No flow limiting stenosis. Degree of stenosis less than 50% bilaterally. 2. Right vertebral not visualized. Left vertebral is patent with antegrade flow. Electronically Signed   By: Maisie Fus  Register   On: 05/10/2016 11:11   Ct Head Code Stroke W/o Cm  Result Date: 05/09/2016 CLINICAL DATA:  Code stroke. Slurred speech. Word-finding difficulties. Assess for stroke. History of hypertension, diabetes and stroke. EXAM: CT HEAD WITHOUT CONTRAST TECHNIQUE: Contiguous axial images were obtained from the base of the skull through the vertex without intravenous contrast. COMPARISON:  MRI of the head July 16, 2006 FINDINGS: BRAIN: No intraparenchymal hemorrhage, mass effect nor midline shift. Old large RIGHT superior cerebellar infarct with ex vacuo dilatation fourth ventricle, RIGHT brachium pontis atrophy. Confluent supratentorial white matter hypodensities. No acute large vascular territory infarcts. Similarly prominent bifrontal extra-axial fluid spaces. 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 Basal cisterns are patent. VASCULAR: Moderate calcific atherosclerosis of the carotid siphons with dolichoectasia compatible chronic hypertension. SKULL: No skull fracture. Small LEFT frontal scalp hematoma or mass, recommend direct inspection. LEFT suboccipital scalp lipoma. SINUSES/ORBITS: The mastoid air-cells and included paranasal sinuses are well-aerated.The included ocular globes and orbital contents are non-suspicious. OTHER: None. ASPECTS [facility] Stroke Program Early CT Score) - Ganglionic level infarction (caudate, lentiform nuclei, internal capsule, insula, M1-M3  cortex): 7 - Supraganglionic infarction (M4-M6 cortex): 3 Total score (0-10 with 10 being normal): 10 IMPRESSION: 1. No acute intracranial process. 2. ASPECTS is 10. 3. 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 Severe chronic small vessel ischemic disease. Critical Value/emergent results were called by telephone at the time of interpretation on 05/09/2016 at 6:52 pm to Dr. Minna Antis , who verbally acknowledged these results. Electronically Signed   By: Awilda Metro M.D.   On: 05/09/2016 18:53    Assessment: 83 y.o. male presenting with difficulty with speech.  MRI of the brain reviewed and shows two small left MCA territory acute infarcts.  Suspect artery to artery embolus as etiology.  MRA shows 30-50% supraclinoid right ICA stenosis.  Carotid dopplers show no evidence of hemodynamically significant stenosis.  Echocardiogram pending.  A1c pending, LDL 104.  Stroke Risk Factors - diabetes mellitus, hyperlipidemia and hypertension  Plan: 1. HgbA1c pending 2. PT consult, OT consult, Speech consult 3. Echocardiogram pending 4. Prophylactic therapy-ASA 81 and Plavix 75mg  daily.  Would discontinue Aggrenox 5. Telemetry monitoring 6. Frequent neuro checks 7. Aggressive lipid management with target LDL<70.  Case discussed with Dr. Ileana Roup, MD Neurology [phone] 05/10/2016, 12:50 PM

## 2016-05-10 NOTE — Progress Notes (Signed)
*  PRELIMINARY RESULTS* Echocardiogram 2D Echocardiogram has been performed.  Stephen [name] 05/10/2016, 2:46 PM

## 2016-05-11 DIAGNOSIS — G459 Transient cerebral ischemic attack, unspecified: Secondary | ICD-10-CM

## 2016-05-11 DIAGNOSIS — E785 Hyperlipidemia, unspecified: Secondary | ICD-10-CM | POA: Diagnosis present

## 2016-05-11 DIAGNOSIS — Z7984 Long term (current) use of oral hypoglycemic drugs: Secondary | ICD-10-CM | POA: Diagnosis not present

## 2016-05-11 DIAGNOSIS — I6521 Occlusion and stenosis of right carotid artery: Secondary | ICD-10-CM | POA: Diagnosis present

## 2016-05-11 DIAGNOSIS — R29703 NIHSS score 3: Secondary | ICD-10-CM | POA: Diagnosis present

## 2016-05-11 DIAGNOSIS — R4701 Aphasia: Secondary | ICD-10-CM | POA: Diagnosis present

## 2016-05-11 DIAGNOSIS — I252 Old myocardial infarction: Secondary | ICD-10-CM | POA: Diagnosis not present

## 2016-05-11 DIAGNOSIS — I10 Essential (primary) hypertension: Secondary | ICD-10-CM | POA: Diagnosis present

## 2016-05-11 DIAGNOSIS — Z87891 Personal history of nicotine dependence: Secondary | ICD-10-CM | POA: Diagnosis not present

## 2016-05-11 DIAGNOSIS — F0390 Unspecified dementia without behavioral disturbance: Secondary | ICD-10-CM | POA: Diagnosis present

## 2016-05-11 DIAGNOSIS — I69398 Other sequelae of cerebral infarction: Secondary | ICD-10-CM | POA: Diagnosis not present

## 2016-05-11 DIAGNOSIS — Z8249 Family history of ischemic heart disease and other diseases of the circulatory system: Secondary | ICD-10-CM | POA: Diagnosis not present

## 2016-05-11 DIAGNOSIS — E119 Type 2 diabetes mellitus without complications: Secondary | ICD-10-CM | POA: Diagnosis present

## 2016-05-11 DIAGNOSIS — R4781 Slurred speech: Secondary | ICD-10-CM | POA: Diagnosis not present

## 2016-05-11 DIAGNOSIS — I63412 Cerebral infarction due to embolism of left middle cerebral artery: Secondary | ICD-10-CM | POA: Diagnosis present

## 2016-05-11 DIAGNOSIS — E876 Hypokalemia: Secondary | ICD-10-CM | POA: Diagnosis not present

## 2016-05-11 DIAGNOSIS — I359 Nonrheumatic aortic valve disorder, unspecified: Secondary | ICD-10-CM | POA: Diagnosis not present

## 2016-05-11 DIAGNOSIS — Z79899 Other long term (current) drug therapy: Secondary | ICD-10-CM | POA: Diagnosis not present

## 2016-05-11 DIAGNOSIS — R471 Dysarthria and anarthria: Secondary | ICD-10-CM | POA: Diagnosis not present

## 2016-05-11 LAB — HEMOGLOBIN A1C
Hgb A1c MFr Bld: 5.9 % — ABNORMAL HIGH (ref 4.8–5.6)
Hgb A1c MFr Bld: 5.8 % — ABNORMAL HIGH (ref 4.8–5.6)
MEAN PLASMA GLUCOSE: 123 mg/dL
MEAN PLASMA GLUCOSE: 120 mg/dL

## 2016-05-11 LAB — GLUCOSE, CAPILLARY
Glucose-Capillary: 82 mg/dL (ref 65–99)
Glucose-Capillary: 100 mg/dL — ABNORMAL HIGH (ref 65–99)

## 2016-05-11 MED ORDER — CLOPIDOGREL BISULFATE 75 MG PO TABS: 75.0000 mg | ORAL_TABLET | Freq: Every day | ORAL | 0 refills | Status: AC

## 2016-05-11 MED ORDER — ASPIRIN EC 81 MG PO TBEC: 81.0000 mg | DELAYED_RELEASE_TABLET | Freq: Every day | ORAL | 0 refills | Status: AC

## 2016-05-11 NOTE — Progress Notes (Signed)
qPhysical Therapy Treatment Patient Details Name: Stephen [name] MRN: [id number] DOB: 18-Jul-1926 Today's Date: 05/11/2016    History of Present Illness 83 yo male with onset of dysarthria, acute L posterior frontal cortex strokes, but has PMHx:  B cerebellar stroke, thalamic and basal ganglia strokes, stroke of corpus callosum, DM, MI     PT Comments    Pt is up to walk with PT noted his weakness on RLE requiring rest after 95'.  Pt is with family who were there to talk with pt about using his wife and family to assist his gait.  He is adamant about not going to SNF and is able with assist to get home.  Will follow acutely as pt is still needing to walk and strengthen, up in chair at end of session with chair alarm in place.  Will still recommend home with mobility assistance.   Follow Up Recommendations  Home health PT;Supervision for mobility/OOB     Equipment Recommendations  Rolling walker with 5" wheels    Recommendations for Other Services       Precautions / Restrictions Precautions Precautions: Fall (telemetry) Restrictions Weight Bearing Restrictions: No    Mobility  Bed Mobility Overal bed mobility: Needs Assistance Bed Mobility: Supine to Sit     Supine to sit: Min guard     General bed mobility comments: reminders for safety at bedside with hand placement  Transfers Overall transfer level: Needs assistance Equipment used: Rolling walker (2 wheeled);1 person hand held assist Transfers: Sit to/from [employer] Sit to Stand: Min guard;Mod assist (guard higher surfaces, mod from lower height) Stand pivot transfers: Min guard       General transfer comment: reminders for hand placement, cues for safety to get to chair before sitting  Ambulation/Gait Ambulation/Gait assistance: Min guard;Min assist (min assist x 2 for missteps and R knee buckling) Ambulation Distance (Feet): 125 Feet Assistive device: Rolling walker (2 wheeled);1 person hand  held assist Gait Pattern/deviations: Step-through pattern;Wide base of support;Trunk flexed;Drifts right/left;Decreased stride length Gait velocity: reduced Gait velocity interpretation: Below normal speed for age/gender General Gait Details: continues to struggle with RLE but improved from yesterday, one buckling episode with gait, sat and rested   Stairs            Wheelchair Mobility    Modified Rankin (Stroke Patients Only)       Balance Overall balance assessment: Needs assistance Sitting-balance support: Feet supported Sitting balance-Leahy Scale: Good     Standing balance support: Bilateral upper extremity supported Standing balance-Leahy Scale: Fair Standing balance comment: fair- dynamic balance in standing                             Cognition Arousal/Alertness: Awake/alert Behavior During Therapy: Impulsive Overall Cognitive Status: History of cognitive impairments - at baseline                                 General Comments: Pt is very poor judge of his limits      Exercises General Exercises - Lower Extremity Ankle Circles/Pumps: AROM;Both;10 reps Gluteal Sets: AROM;Both;15 reps Heel Slides: AROM;Both;15 reps Hip ABduction/ADduction: AROM;Both;15 reps    General Comments General comments (skin integrity, edema, etc.): requires some direction for all mobility and will make unsafe choices otherwise.  Talked with pt about the need to let family help him as heis not safe.  Pertinent Vitals/Pain Pain Assessment: No/denies pain    Home Living                      Prior Function            PT Goals (current goals can now be found in the care plan section) Acute Rehab PT Goals Patient Stated Goal: get home and no not going to SNF Progress towards PT goals: Progressing toward goals    Frequency    7X/week      PT Plan Current plan remains appropriate    Co-evaluation             End of  Session Equipment Utilized During Treatment: Gait belt Activity Tolerance: Patient tolerated treatment well;Patient limited by fatigue;Other (comment) (RLE buckling) Patient left: in chair;with call bell/phone within reach;with chair alarm set;with nursing/sitter in room;with family/visitor present Nurse Communication: Mobility status PT Visit Diagnosis: Other abnormalities of gait and mobility (R26.89);Unsteadiness on feet (R26.81)     Time: 1610-9604 PT Time Calculation (min) (ACUTE ONLY): 32 min  Charges:  $Gait Training: 8-22 mins $Therapeutic Exercise: 8-22 mins                    G Codes:  Functional Assessment Tool Used: AM-PAC 6 Clicks Basic Mobility      Ivar Drape 05/11/2016, 10:34 AM   Samul Dada, PT MS Acute Rehab Dept. Number: [facility] [id number] and [facility] [phone]

## 2016-05-11 NOTE — Care Management (Signed)
Discharge to home today per Dr. Renae Gloss. Physical therapy evaluation completed. Recommending home health and physical therapy. Declining these services at this time. Rolling walker in the room for home usage. Family will transport. Gwenette Greet RN MSN CCM Care Management

## 2016-05-11 NOTE — Discharge Summary (Signed)
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 Diabetes mellitus without complication (HCC)   . Hypertension   . MI (myocardial infarction)   . Stroke (cerebrum) [facility])     HOSPITAL COURSE:   1. 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 Patient's Aggrenox was discontinued and aspirin and Plavix prescribed. Patient was seen in consultation by neurology. Patient's presenting symptoms with slurred speech. He had no motor difficulties as per the patient. He declined home health evaluation. Patient on Lipitor 40 mg also. Patient was kept an extra night in the hospital because the echocardiogram commented on a vegetation on the aortic valve. In reviewing the echocardiogram with the cardiologist this is calcified aortic valve and not a vegetation. 2. Essential hypertension on Norvasc and lisinopril 3. Type 2 diabetes on metformin 4. Hypokalemia replaced during the hospital course  DISCHARGE CONDITIONS:   Satisfactory  CONSULTS OBTAINED:  Treatment Team:  Thana Farr, MD Antonieta Iba, MD  DRUG ALLERGIES:  No Known Allergies  DISCHARGE MEDICATIONS:   Discharge Medication List as of 05/11/2016 12:04 PM    CONTINUE these medications which have CHANGED   Details  aspirin EC 81 MG tablet Take 1 tablet (81 mg total) by mouth daily., Starting Thu 05/11/2016, Print    clopidogrel (PLAVIX) 75 MG tablet Take 1 tablet (75 mg total) by mouth daily., Starting Thu 05/11/2016,  Print      CONTINUE these medications which have NOT CHANGED   Details  amLODipine (NORVASC) 10 MG tablet Take 10 mg by mouth daily., Starting Tue 05/09/2016, Historical Med    atorvastatin (LIPITOR) 40 MG tablet Take 40 mg by mouth daily., Starting Tue 04/11/2016, Historical Med    ferrous sulfate 325 (65 FE) MG tablet Take 325 mg by mouth daily with breakfast., Historical Med    IBU 800 MG tablet Take 800 mg by mouth 2 (two) times daily as needed. , Starting Tue 05/09/2016, Historical Med    lisinopril (PRINIVIL,ZESTRIL) 20 MG tablet Take 20 mg by mouth 2 (two) times daily. , Starting Tue 05/09/2016, Historical Med    metFORMIN (GLUCOPHAGE) 500 MG tablet Take 500 mg by mouth daily., Starting Tue 05/09/2016, Historical Med      STOP taking these medications     dipyridamole-aspirin (AGGRENOX) 200-25 MG 12hr capsule      polyethylene glycol (MIRALAX / GLYCOLAX) packet          DISCHARGE INSTRUCTIONS:   Follow-up with PMD one week Follow-up with neurology in a few weeks  If you experience worsening of your admission symptoms, develop shortness of breath, life threatening emergency, suicidal or homicidal thoughts you must seek medical attention immediately by calling 911 or calling your MD immediately  if symptoms less severe.  You Must read complete instructions/literature along with all the possible adverse reactions/side effects for all the Medicines you take and that have been prescribed to you. Take any new Medicines after you have completely understood and accept all the possible adverse reactions/side effects.   Please  note  You were cared for by a hospitalist during your hospital stay. If you have any questions about your discharge medications or the care you received while you were in the hospital after you are discharged, you can call the unit and asked to speak with the hospitalist on call if the hospitalist that took care of you is not available. Once you are discharged, your  primary care physician will handle any further medical issues. Please note that NO REFILLS for any discharge medications will be authorized once you are discharged, as it is imperative that you return to your primary care physician (or establish a relationship with a primary care physician if you do not have one) for your aftercare needs so that they can reassess your need for medications and monitor your lab values.    Today   CHIEF COMPLAINT:   Chief Complaint  Patient presents with  . Aphasia    HISTORY OF PRESENT ILLNESS:  Stephen [name]  is a 83 y.o. male with a known history of Stroke presented with slurred speech   VITAL SIGNS:  Blood pressure 117/66, pulse 92, temperature 98.3 F (36.8 C), resp. rate 18, height 6' (1.829 m), weight 84.4 kg (186 lb), SpO2 100 %.   PHYSICAL EXAMINATION:  GENERAL:  83 y.o.-year-old patient lying in the bed with no acute distress.  EYES: Pupils equal, round, reactive to light and accommodation. No scleral icterus. Extraocular muscles intact.  HEENT: Head atraumatic, normocephalic. Oropharynx and nasopharynx clear.  NECK:  Supple, no jugular venous distention. No thyroid enlargement, no tenderness.  LUNGS: Normal breath sounds bilaterally, no wheezing, rales,rhonchi or crepitation. No use of accessory muscles of respiration.  CARDIOVASCULAR: S1, S2 normal. No murmurs, rubs, or gallops.  ABDOMEN: Soft, non-tender, non-distended. Bowel sounds present. No organomegaly or mass.  EXTREMITIES: No pedal edema, cyanosis, or clubbing.  NEUROLOGIC: Cranial nerves II through XII are intact. Muscle strength 5/5 in all extremities. Sensation intact. Gait not checked.  PSYCHIATRIC: The patient is alert and oriented x 3.  SKIN: No obvious rash, lesion, or ulcer.   DATA REVIEW:   CBC  Recent Labs Lab 05/09/16 1834  WBC 3.8  HGB 13.6  HCT 41.5  PLT 184    Chemistries   Recent Labs Lab 05/09/16 1834  NA 135  K 3.3*  CL 102  CO2 26  GLUCOSE  130*  BUN 14  CREATININE 0.99  CALCIUM 8.8*  AST 22  ALT 13*  ALKPHOS 81  BILITOT 0.4    Cardiac Enzymes  Recent Labs Lab 05/09/16 1834  TROPONINI <0.03    Microbiology Results  Results for orders placed or performed during the hospital encounter of 05/09/16  CULTURE, BLOOD (ROUTINE X 2) w Reflex to ID Panel     Status: None (Preliminary result)   Collection Time: 05/10/16  5:15 PM  Result Value Ref Range Status   Specimen Description BLOOD LEFT FOREARM  Final   Special Requests   Final    BOTTLES DRAWN AEROBIC AND ANAEROBIC Blood Culture adequate volume   Culture NO GROWTH < 24 HOURS  Final   Report Status PENDING  Incomplete  CULTURE, BLOOD (ROUTINE X 2) w Reflex to ID Panel     Status: None (Preliminary result)   Collection Time: 05/10/16  5:25 PM  Result Value Ref Range Status   Specimen Description BLOOD RIGHT FOREARM  Final   Special Requests   Final    BOTTLES DRAWN AEROBIC AND ANAEROBIC Blood Culture adequate volume  Culture NO GROWTH < 24 HOURS  Final   Report Status PENDING  Incomplete    RADIOLOGY:  Mr Shirlee Latch ZO Contrast  Result Date: 05/10/2016 CLINICAL DATA:  Dementia.  Acute presentation with slurred speech. EXAM: MRI HEAD WITHOUT CONTRAST MRA HEAD WITHOUT CONTRAST TECHNIQUE: Multiplanar, multiecho pulse sequences of the brain and surrounding structures were obtained without intravenous contrast. Angiographic images of the head were obtained using MRA technique without contrast. COMPARISON:  CT 05/09/2016.  MRI 07/16/2006. FINDINGS: MRI HEAD FINDINGS Brain: Diffusion imaging shows a 7 mm at acute infarction at the left posterior frontal cortex and a second 7 mm acute infarction more towards the vertex in the left posterior frontal cortex. These are consistent with micro embolic infarctions. No other acute infarction. There chronic small-vessel ischemic changes affecting the pons. There is old cerebellar infarction on the right. Few old small vessel left  cerebellar infarctions. Chronic small-vessel ischemic changes are seen affecting the thalami, basal ganglia and hemispheric white matter. Old corpus callosum infarction. No mass lesion, hemorrhage, hydrocephalus or extra-axial collection. Vascular: Flow is present in both internal carotid artery's an the left vertebral artery and basilar artery. No flow seen in a right vertebral. Skull and upper cervical spine: Negative Sinuses/Orbits: Clear/normal Other: None significant MRA HEAD FINDINGS Both internal carotid arteries are patent through the skullbase. There is atherosclerotic change in both carotid siphon regions but without flow limiting stenosis. 30-50% stenosis of the supraclinoid ICA on the right. Right ICA supplies only the right middle cerebral artery territory. Left ICA supplies the left middle cerebral artery territory and both anterior cerebral artery territories. No antegrade flow seen in the right vertebral artery. Left vertebral artery is widely patent to the basilar. Patent bilateral anterior inferior cerebellar arteries. No basilar stenosis. Superior cerebellar and posterior cerebral arteries are patent, right PCA receiving it supply from the anterior circulation. Minimal contribution from the basilar tip. Left PCA receives most of it supply from the posterior circulation, though there is a large posterior communicating artery on the left. More distal intracranial branch vessels do show atherosclerotic narrowing and irregularity. IMPRESSION: Two subcentimeter acute infarctions in the left MCA territory affecting the posterior frontal cortex, consistent with acute embolic infarctions. No evidence of swelling or hemorrhage. Extensive chronic small vessel ischemic changes throughout the brain. Extensive old right cerebellar infarction. Chronic right vertebral artery occlusion. 30-50% stenosis of the supraclinoid ICA on the right. Distal vessel atherosclerotic irregularity diffusely. 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 Right PCA receives its supply from the anterior circulation. Left PCA supply is mixed. Electronically Signed   By: Paulina Fusi M.D.   On: 05/10/2016 10:04   Mr Brain Wo Contrast  Result Date: 05/10/2016 CLINICAL DATA:  Dementia.  Acute presentation with slurred speech. EXAM: MRI HEAD WITHOUT CONTRAST MRA HEAD WITHOUT CONTRAST TECHNIQUE: Multiplanar, multiecho pulse sequences of the brain and surrounding structures were obtained without intravenous contrast. Angiographic images of the head were obtained using MRA technique without contrast. COMPARISON:  CT 05/09/2016.  MRI 07/16/2006. FINDINGS: MRI HEAD FINDINGS Brain: Diffusion imaging shows a 7 mm at acute infarction at the left posterior frontal cortex and a second 7 mm acute infarction more towards the vertex in the left posterior frontal cortex. These are consistent with micro embolic infarctions. No other acute infarction. There chronic small-vessel ischemic changes affecting the pons. There is old cerebellar infarction on the right. Few old small vessel left cerebellar infarctions. Chronic small-vessel ischemic changes are seen affecting the thalami, basal  ganglia and hemispheric white matter. Old corpus callosum infarction. No mass lesion, hemorrhage, hydrocephalus or extra-axial collection. Vascular: Flow is present in both internal carotid artery's an the left vertebral artery and basilar artery. No flow seen in a right vertebral. Skull and upper cervical spine: Negative Sinuses/Orbits: Clear/normal Other: None significant MRA HEAD FINDINGS Both internal carotid arteries are patent through the skullbase. There is atherosclerotic change in both carotid siphon regions but without flow limiting stenosis. 30-50% stenosis of the supraclinoid ICA on the right. Right ICA supplies only the right middle cerebral artery territory. Left ICA supplies the left middle cerebral artery territory and both anterior cerebral artery territories. No  antegrade flow seen in the right vertebral artery. Left vertebral artery is widely patent to the basilar. Patent bilateral anterior inferior cerebellar arteries. No basilar stenosis. Superior cerebellar and posterior cerebral arteries are patent, right PCA receiving it supply from the anterior circulation. Minimal contribution from the basilar tip. Left PCA receives most of it supply from the posterior circulation, though there is a large posterior communicating artery on the left. More distal intracranial branch vessels do show atherosclerotic narrowing and irregularity. IMPRESSION: Two subcentimeter acute infarctions in the left MCA territory affecting the posterior frontal cortex, consistent with acute embolic infarctions. No evidence of swelling or hemorrhage. Extensive chronic small vessel ischemic changes throughout the brain. Extensive old right cerebellar infarction. Chronic right vertebral artery occlusion. 30-50% stenosis of the supraclinoid ICA on the right. Distal vessel atherosclerotic irregularity diffusely. 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 Right PCA receives its supply from the anterior circulation. Left PCA supply is mixed. Electronically Signed   By: Paulina Fusi M.D.   On: 05/10/2016 10:04   US Carotid Bilateral  Result Date: 05/10/2016 CLINICAL DATA:  Slurred speech. EXAM: BILATERAL CAROTID DUPLEX ULTRASOUND TECHNIQUE: Wallace Cullens scale imaging, color Doppler and duplex ultrasound were performed of bilateral carotid and vertebral arteries in the neck. COMPARISON:  MRI 05/10/2016.  Ultrasound 07/16/2006. FINDINGS: Criteria: Quantification of carotid stenosis is based on velocity parameters that correlate the residual internal carotid diameter with NASCET-based stenosis levels, using the diameter of the distal internal carotid lumen as the denominator for stenosis measurement. The following velocity measurements were obtained: RIGHT ICA:  72/16 cm/sec CCA:  116/12 cm/sec SYSTOLIC ICA/CCA  RATIO:  0.6 DIASTOLIC ICA/CCA RATIO:  1.4 ECA:  85 cm/sec LEFT ICA:  60/16 cm/sec CCA:  86/13 cm/sec SYSTOLIC ICA/CCA RATIO:  0.7 DIASTOLIC ICA/CCA RATIO:  1.2 ECA:  89 cm/sec RIGHT CAROTID ARTERY: Diffuse mild right carotid intimal thickening/ atherosclerotic vascular disease. No flow limiting stenosis. RIGHT VERTEBRAL ARTERY:  Not visualized. LEFT CAROTID ARTERY: Diffuse left common carotid intimal thickening/ atherosclerotic vascular disease. No flow limiting stenosis. LEFT VERTEBRAL ARTERY:  Patent with antegrade flow. IMPRESSION: 1. Diffuse mild bilateral carotid intimal thickening/ atherosclerotic vascular disease. No flow limiting stenosis. Degree of stenosis less than 50% bilaterally. 2. Right vertebral not visualized. Left vertebral is patent with antegrade flow. Electronically Signed   By: Maisie Fus  Register   On: 05/10/2016 11:11   Ct Head Code Stroke W/o Cm  Result Date: 05/09/2016 CLINICAL DATA:  Code stroke. Slurred speech. Word-finding difficulties. Assess for stroke. History of hypertension, diabetes and stroke. EXAM: CT HEAD WITHOUT CONTRAST TECHNIQUE: Contiguous axial images were obtained from the base of the skull through the vertex without intravenous contrast. COMPARISON:  MRI of the head July 16, 2006 FINDINGS: BRAIN: No intraparenchymal hemorrhage, mass effect nor midline shift. Old large RIGHT superior cerebellar infarct with ex vacuo  dilatation fourth ventricle, RIGHT brachium pontis atrophy. Confluent supratentorial white matter hypodensities. No acute large vascular territory infarcts. Similarly prominent bifrontal extra-axial fluid spaces. 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 Basal cisterns are patent. VASCULAR: Moderate calcific atherosclerosis of the carotid siphons with dolichoectasia compatible chronic hypertension. SKULL: No skull fracture. Small LEFT frontal scalp hematoma or mass, recommend direct inspection. LEFT suboccipital scalp lipoma. SINUSES/ORBITS: The mastoid air-cells and included  paranasal sinuses are well-aerated.The included ocular globes and orbital contents are non-suspicious. OTHER: None. ASPECTS [facility] Stroke Program Early CT Score) - Ganglionic level infarction (caudate, lentiform nuclei, internal capsule, insula, M1-M3 cortex): 7 - Supraganglionic infarction (M4-M6 cortex): 3 Total score (0-10 with 10 being normal): 10 IMPRESSION: 1. No acute intracranial process. 2. ASPECTS is 10. 3. 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 Severe chronic small vessel ischemic disease. Critical Value/emergent results were called by telephone at the time of interpretation on 05/09/2016 at 6:52 pm to Dr. Minna Antis , who verbally acknowledged these results. Electronically Signed   By: Awilda Metro M.D.   On: 05/09/2016 18:53   Management plans discussed with the patient, family and they are in agreement.  CODE STATUS:  Code Status History    Date Active Date Inactive Code Status Order ID Comments User Context   05/09/2016  8:12 PM 05/11/2016  4:32 PM Full Code [id number]  Milagros Loll, MD ED      TOTAL TIME TAKING CARE OF THIS PATIENT: 35 minutes.    Alford Highland M.D on 05/11/2016 at 5:00 PM  Between 7am to 6pm - Pager - [phone]  After 6pm go to [email or web address] - Social research officer, government  [employer] Office  [phone]  CC: Primary care physician; [facility]

## 2016-05-11 NOTE — Discharge Instructions (Signed)
Follow-up with primary care physician in a week   follow-up with neurology Dr. Sherryll Burger  in one month

## 2016-05-11 NOTE — Progress Notes (Signed)
   Asked to evaluate patient for possible TEE.  Dr. Mariah Milling has reviewed echocardiogram from 05/10/2016 and suspects that echogenic structure arising from the Ao valve most likely represents calcification.  This being the case, will not plan on TEE @ this time.  Await blood cultures.  Follow tele.  Nicolasa Ducking, NP

## 2016-05-15 LAB — BLOOD CULTURE ID PANEL (REFLEXED)
ACINETOBACTER BAUMANNII: NOT DETECTED
CANDIDA ALBICANS: NOT DETECTED
CANDIDA TROPICALIS: NOT DETECTED
Candida glabrata: NOT DETECTED
Candida krusei: NOT DETECTED
Candida parapsilosis: NOT DETECTED
ENTEROBACTER CLOACAE COMPLEX: NOT DETECTED
ENTEROBACTERIACEAE SPECIES: NOT DETECTED
ENTEROCOCCUS SPECIES: NOT DETECTED
Escherichia coli: NOT DETECTED
HAEMOPHILUS INFLUENZAE: NOT DETECTED
Klebsiella oxytoca: NOT DETECTED
Klebsiella pneumoniae: NOT DETECTED
LISTERIA MONOCYTOGENES: NOT DETECTED
NEISSERIA MENINGITIDIS: NOT DETECTED
PSEUDOMONAS AERUGINOSA: NOT DETECTED
Proteus species: NOT DETECTED
STREPTOCOCCUS AGALACTIAE: NOT DETECTED
STREPTOCOCCUS PNEUMONIAE: NOT DETECTED
STREPTOCOCCUS PYOGENES: NOT DETECTED
STREPTOCOCCUS SPECIES: NOT DETECTED
Serratia marcescens: NOT DETECTED
Staphylococcus aureus (BCID): NOT DETECTED
Staphylococcus species: NOT DETECTED

## 2016-05-15 LAB — CULTURE, BLOOD (ROUTINE X 2)
Culture: NO GROWTH
SPECIAL REQUESTS: ADEQUATE

## 2016-05-15 NOTE — Progress Notes (Addendum)
PHARMACY - PHYSICIAN COMMUNICATION CRITICAL VALUE ALERT - BLOOD CULTURE IDENTIFICATION (BCID)  No results found for this or any previous visit. Spoke with rounding PharmD, will speak with MD Wieting during rounds about BCID results of this discharged patient.   [name],Stephen D 05/15/2016  1:27 PM   05/15/2016 15:25 Followed up culture with Dr. Renae Gloss - likely contaminant and only in 1 of 4 bottles. GPR include clostridium, lactobacillus, listeria, corynebacterium. Dr. Renae Gloss asked me to speak with Stephen [name] to assess need for further treatment. I called and spoke with Mrs. Million - [name] is doing fine since discharge. She says he picked up and is taking his medication. Denies fevers/chills and has no needs at this time.   Daijha Leggio A. [place], [place].D., BCPS Clinical Pharmacist 05/15/2016 15:25

## 2016-05-15 NOTE — Progress Notes (Signed)
Patient ID: Stephen [name], male   DOB: 1926-12-09, 83 y.o.   MRN: [id number]  Gm positive rod likely skin contamination only in one bottle. Since echo likely thought to be calcium and not a vegitation, likely we do not need to treat Pharmacist to call patient and see how he is doing and assess on wheather abx needed or not.  Jessejames Steelman [employer]

## 2016-05-17 LAB — CULTURE, BLOOD (ROUTINE X 2): Special Requests: ADEQUATE

## 2016-06-30 ENCOUNTER — Observation Stay
Admission: EM | Admit: 2016-06-30 | Discharge: 2016-07-02 | Disposition: A | Discharge status: Home / Self Care | Payer: Medicare Other | Attending: Internal Medicine | Admitting: Internal Medicine

## 2016-06-30 ENCOUNTER — Encounter: Payer: Self-pay | Admitting: Emergency Medicine

## 2016-06-30 DIAGNOSIS — Z79899 Other long term (current) drug therapy: Secondary | ICD-10-CM | POA: Insufficient documentation

## 2016-06-30 DIAGNOSIS — Z7982 Long term (current) use of aspirin: Secondary | ICD-10-CM | POA: Diagnosis not present

## 2016-06-30 DIAGNOSIS — Z7984 Long term (current) use of oral hypoglycemic drugs: Secondary | ICD-10-CM | POA: Insufficient documentation

## 2016-06-30 DIAGNOSIS — I10 Essential (primary) hypertension: Secondary | ICD-10-CM | POA: Insufficient documentation

## 2016-06-30 DIAGNOSIS — Z7902 Long term (current) use of antithrombotics/antiplatelets: Secondary | ICD-10-CM | POA: Diagnosis not present

## 2016-06-30 DIAGNOSIS — E119 Type 2 diabetes mellitus without complications: Secondary | ICD-10-CM | POA: Diagnosis not present

## 2016-06-30 DIAGNOSIS — I252 Old myocardial infarction: Secondary | ICD-10-CM | POA: Diagnosis not present

## 2016-06-30 DIAGNOSIS — Z8249 Family history of ischemic heart disease and other diseases of the circulatory system: Secondary | ICD-10-CM | POA: Diagnosis not present

## 2016-06-30 DIAGNOSIS — Z8673 Personal history of transient ischemic attack (TIA), and cerebral infarction without residual deficits: Secondary | ICD-10-CM | POA: Insufficient documentation

## 2016-06-30 DIAGNOSIS — K2900 Acute gastritis without bleeding: Secondary | ICD-10-CM | POA: Diagnosis not present

## 2016-06-30 DIAGNOSIS — Z791 Long term (current) use of non-steroidal anti-inflammatories (NSAID): Secondary | ICD-10-CM | POA: Insufficient documentation

## 2016-06-30 DIAGNOSIS — K3189 Other diseases of stomach and duodenum: Secondary | ICD-10-CM | POA: Insufficient documentation

## 2016-06-30 DIAGNOSIS — Z87891 Personal history of nicotine dependence: Secondary | ICD-10-CM | POA: Diagnosis not present

## 2016-06-30 DIAGNOSIS — K921 Melena: Secondary | ICD-10-CM | POA: Diagnosis present

## 2016-06-30 DIAGNOSIS — K922 Gastrointestinal hemorrhage, unspecified: Secondary | ICD-10-CM

## 2016-06-30 DIAGNOSIS — K311 Adult hypertrophic pyloric stenosis: Secondary | ICD-10-CM | POA: Diagnosis not present

## 2016-06-30 LAB — LIPASE, BLOOD: LIPASE: 19 U/L (ref 11–51)

## 2016-06-30 LAB — COMPREHENSIVE METABOLIC PANEL
ALK PHOS: 79 U/L (ref 38–126)
ALT: 10 U/L — AB (ref 17–63)
AST: 18 U/L (ref 15–41)
Albumin: 3.8 g/dL (ref 3.5–5.0)
Anion gap: 8 (ref 5–15)
BUN: 22 mg/dL — AB (ref 6–20)
CALCIUM: 8.9 mg/dL (ref 8.9–10.3)
CHLORIDE: 110 mmol/L (ref 101–111)
CO2: 24 mmol/L (ref 22–32)
CREATININE: 1.13 mg/dL (ref 0.61–1.24)
GFR, EST NON AFRICAN AMERICAN: 56 mL/min — AB (ref >60)
Glucose, Bld: 121 mg/dL — ABNORMAL HIGH (ref 65–99)
Potassium: 3.7 mmol/L (ref 3.5–5.1)
Sodium: 142 mmol/L (ref 135–145)
Total Bilirubin: 0.7 mg/dL (ref 0.3–1.2)
Total Protein: 6.4 g/dL — ABNORMAL LOW (ref 6.5–8.1)

## 2016-06-30 LAB — URINALYSIS, COMPLETE (UACMP) WITH MICROSCOPIC
Bacteria, UA: NONE SEEN
Bilirubin Urine: NEGATIVE
GLUCOSE, UA: 50 mg/dL — AB
Ketones, ur: 20 mg/dL — AB
Leukocytes, UA: NEGATIVE
Nitrite: NEGATIVE
PH: 5 (ref 5.0–8.0)
Protein, ur: 30 mg/dL — AB
SPECIFIC GRAVITY, URINE: 1.03 (ref 1.005–1.030)

## 2016-06-30 LAB — CBC
HCT: 34.4 % — ABNORMAL LOW (ref 40.0–52.0)
Hemoglobin: 11.5 g/dL — ABNORMAL LOW (ref 13.0–18.0)
MCH: 28.7 pg (ref 26.0–34.0)
MCHC: 33.5 g/dL (ref 32.0–36.0)
MCV: 85.6 fL (ref 80.0–100.0)
PLATELETS: 192 10*3/uL (ref 150–440)
RBC: 4.01 MIL/uL — AB (ref 4.40–5.90)
RDW: 13.6 % (ref 11.5–14.5)
WBC: 4.9 10*3/uL (ref 3.8–10.6)

## 2016-06-30 LAB — GLUCOSE, CAPILLARY
GLUCOSE-CAPILLARY: 173 mg/dL — AB (ref 65–99)
GLUCOSE-CAPILLARY: 84 mg/dL (ref 65–99)
Glucose-Capillary: 234 mg/dL — ABNORMAL HIGH (ref 65–99)

## 2016-06-30 LAB — TYPE AND SCREEN
ABO/RH(D): O POS
ANTIBODY SCREEN: NEGATIVE

## 2016-06-30 LAB — PROTIME-INR
INR: 1.1
PROTHROMBIN TIME: 14.2 s (ref 11.4–15.2)

## 2016-06-30 LAB — APTT: APTT: 26 s (ref 24–36)

## 2016-06-30 MED ORDER — FERROUS SULFATE 325 (65 FE) MG PO TABS
325.0000 mg | ORAL_TABLET | Freq: Every day | ORAL | Status: DC
  Administered 2016-07-01 – 2016-07-02 (×2): 325 mg via ORAL
  Filled 2016-06-30 (×2): qty 1

## 2016-06-30 MED ORDER — AMLODIPINE BESYLATE 10 MG PO TABS
10.0000 mg | ORAL_TABLET | Freq: Every day | ORAL | Status: DC
  Administered 2016-06-30 – 2016-07-02 (×3): 10 mg via ORAL
  Filled 2016-06-30 (×3): qty 1

## 2016-06-30 MED ORDER — LISINOPRIL 20 MG PO TABS
20.0000 mg | ORAL_TABLET | Freq: Two times a day (BID) | ORAL | Status: DC
  Administered 2016-06-30 – 2016-07-02 (×5): 20 mg via ORAL
  Filled 2016-06-30 (×4): qty 1

## 2016-06-30 MED ORDER — DOCUSATE SODIUM 100 MG PO CAPS: 100.0000 mg | ORAL_CAPSULE | Freq: Two times a day (BID) | ORAL | Status: DC | PRN

## 2016-06-30 MED ORDER — ATORVASTATIN CALCIUM 20 MG PO TABS
40.0000 mg | ORAL_TABLET | Freq: Every day | ORAL | Status: DC
  Administered 2016-06-30 – 2016-07-01 (×2): 40 mg via ORAL
  Filled 2016-06-30: qty 2

## 2016-06-30 MED ORDER — INSULIN ASPART 100 UNIT/ML ~~LOC~~ SOLN
0.0000 [IU] | Freq: Three times a day (TID) | SUBCUTANEOUS | Status: DC
Start: 2016-06-30 — End: 2016-07-02
  Administered 2016-06-30: 3 [IU] via SUBCUTANEOUS
  Administered 2016-06-30: 2 [IU] via SUBCUTANEOUS
  Filled 2016-06-30 (×2): qty 3

## 2016-06-30 MED ORDER — PANTOPRAZOLE SODIUM 40 MG IV SOLR
40.0000 mg | Freq: Two times a day (BID) | INTRAVENOUS | Status: DC
  Administered 2016-06-30 – 2016-07-01 (×4): 40 mg via INTRAVENOUS
  Filled 2016-06-30 (×3): qty 40

## 2016-06-30 NOTE — ED Provider Notes (Addendum)
[facility] Emergency Department Provider Note  ____________________________________________   I have reviewed the triage vital signs and the nursing notes.   HISTORY  Chief Complaint Blood In Stools and Abdominal Pain    HPI Stephen [name] is a 83 y.o. male who presents today complaining of rectal bleeding. Patient is on Plavix for stroke last month. He has had 3 episodes of bloody stool he states. Dark blood. Denies any fever or chills. States she has cramping while he is in the process of having a bowel movement but in between he has no pain and he has no tenderness or pain at this time. He denies hematemesis. He did feel little lightheaded.  Nothing makes it better nothing makes it worse started last night. No other associated symptoms and he has not done anything prior to arrival to alleviate the symptoms   Past Medical History:  Diagnosis Date  . Diabetes mellitus without complication (HCC)   . Hypertension   . MI (myocardial infarction) (HCC)   . Stroke (cerebrum) [facility](HCC)     Patient Active Problem List   Diagnosis Date Noted  . TIA (transient ischemic attack) 05/11/2016  . Dysarthria 05/09/2016    History reviewed. No pertinent surgical history.  Prior to Admission medications   Medication Sig Start Date End Date Taking? Authorizing Provider  amLODipine (NORVASC) 10 MG tablet Take 10 mg by mouth daily. 05/09/16   [provider]  aspirin EC 81 MG tablet Take 1 tablet (81 mg total) by mouth daily. 05/11/16   Alford HighlandWieting, Richard, MD  atorvastatin (LIPITOR) 40 MG tablet Take 40 mg by mouth daily. 04/11/16   [provider]  clopidogrel (PLAVIX) 75 MG tablet Take 1 tablet (75 mg total) by mouth daily. 05/11/16   Alford HighlandWieting, Richard, MD  ferrous sulfate 325 (65 FE) MG tablet Take 325 mg by mouth daily with breakfast.    [provider]  IBU 800 MG tablet Take 800 mg by mouth 2 (two) times daily as needed.  05/09/16   [provider]  lisinopril (PRINIVIL,ZESTRIL) 20 MG tablet Take 20 mg by mouth 2 (two) times daily.  05/09/16   [provider]  metFORMIN (GLUCOPHAGE) 500 MG tablet Take 500 mg by mouth daily. 05/09/16   [provider]    Allergies Patient has no known allergies.  Family History  Problem Relation Age of Onset  . Hypertension Other     Social History Social History  Substance Use Topics  . Smoking status: Former Games developermoker  . Smokeless tobacco: Never Used  . Alcohol use No    Review of Systems Constitutional: No fever/chills Eyes: No visual changes. ENT: No sore throat. No stiff neck no neck pain Cardiovascular: Denies chest pain. Respiratory: Denies shortness of breath. Gastrointestinal:   no vomiting. History of present illness regarding bowel movements.  No constipation. Genitourinary: Negative for dysuria. Musculoskeletal: Negative lower extremity swelling Skin: Negative for rash. Neurological: Negative for severe headaches, focal weakness or numbness. 10-point ROS otherwise negative.  ____________________________________________   PHYSICAL EXAM:  VITAL SIGNS: ED Triage Vitals  Enc Vitals Group     BP --      Pulse Rate 06/30/16 0920 86     Resp 06/30/16 0920 12     Temp 06/30/16 0920 97.9 F (36.6 C)     Temp Source 06/30/16 0920 Oral     SpO2 06/30/16 0917 97 %     Weight --      Height --  Head Circumference --      Peak Flow --      Pain Score --      Pain Loc --      Pain Edu? --      Excl. in GC? --     Constitutional: Alert and oriented. Well appearing and in no acute distress. Eyes: Conjunctivae are normal. PERRL. EOMI. Head: Atraumatic. Nose: No congestion/rhinnorhea. Mouth/Throat: Mucous membranes are moist.  Oropharynx non-erythematous. Neck: No stridor.   Nontender with no meningismus Cardiovascular: Normal rate, regular rhythm. Grossly normal heart sounds.  Good peripheral circulation. Respiratory: Normal respiratory effort.  No  retractions. Lungs CTAB. Abdominal: Soft and nontender. No distention. No guarding no rebound Back:  There is no focal tenderness or step off.  there is no midline tenderness there are no lesions noted. there is no CVA tenderness Rectal exam: Guaiac positive dark red blood noted. Musculoskeletal: No lower extremity tenderness, no upper extremity tenderness. No joint effusions, no DVT signs strong distal pulses no edema Neurologic:  Normal speech and language. No gross focal neurologic deficits are appreciated.  Skin:  Skin is warm, dry and intact. No rash noted. Psychiatric: Mood and affect are normal. Speech and behavior are normal.  ____________________________________________   LABS (all labs ordered are listed, but only abnormal results are displayed)  Labs Reviewed  COMPREHENSIVE METABOLIC PANEL - Abnormal; Notable for the following:       Result Value   Glucose, Bld 121 (*)    BUN 22 (*)    Total Protein 6.4 (*)    ALT 10 (*)    GFR calc non Af Amer 56 (*)    All other components within normal limits  CBC - Abnormal; Notable for the following:    RBC 4.01 (*)    Hemoglobin 11.5 (*)    HCT 34.4 (*)    All other components within normal limits  LIPASE, BLOOD  PROTIME-INR  APTT  URINALYSIS, COMPLETE (UACMP) WITH MICROSCOPIC  POC OCCULT BLOOD, ED  TYPE AND SCREEN   ____________________________________________  EKG  I personally interpreted any EKGs ordered by me or triage ____ Normal sinus rhythm rate 87 bpm, no acute ST elevation or depression, LAD noted. Right bundle-branch block noted L AFB noted _________________________________  RADIOLOGY  I reviewed any imaging ordered by me or triage that were performed during my shift and, if possible, patient and/or family made aware of any abnormal findings. ____________________________________________   PROCEDURES  Procedure(s) performed: None  Procedures  Critical Care performed: CRITICAL CARE Performed by: Jeanmarie Plant   Total critical care time: 40 minutes  Critical care time was exclusive of separately billable procedures and treating other patients.  Critical care was necessary to treat or prevent imminent or life-threatening deterioration.  Critical care was time spent personally by me on the following activities: development of treatment plan with patient and/or surrogate as well as nursing, discussions with consultants, evaluation of patient's response to treatment, examination of patient, obtaining history from patient or surrogate, ordering and performing treatments and interventions, ordering and review of laboratory studies, ordering and review of radiographic studies, pulse oximetry and re-evaluation of patient's condition.   ____________________________________________   INITIAL IMPRESSION / ASSESSMENT AND PLAN / ED COURSE  Pertinent labs & imaging results that were available during my care of the patient were reviewed by me and considered in my medical decision making (see chart for details).  Patient with a GI bleed, has had symptoms since yesterday 9. Hemoglobin is  reassuring but it is down 2 points from 1 month ago. His vital signs are reassuring he will obviously need to be admitted.     ____________________________________________   FINAL CLINICAL IMPRESSION(S) / ED DIAGNOSES  Final diagnoses:  None      This chart was dictated using voice recognition software.  Despite best efforts to proofread,  errors can occur which can change meaning.      Jeanmarie Plant, MD 06/30/16 1028    Jeanmarie Plant, MD 06/30/16 [phone]

## 2016-06-30 NOTE — Progress Notes (Signed)
Family Meeting Note  Advance Directive:no  Today a meeting took place with the Patient, spouse and their daughter.  The following clinical team members were present during this meeting:MD  The following were discussed:Patient's diagnosis: stroke, DM, Htn,. GI bleed, Patient's progosis: Unable to determine and Goals for treatment: DNR  Additional follow-up to be provided: GI consult.  Time spent during discussion:20 minutes  Kiegan Macaraeg, Heath GoldVAIBHAVKUMAR, MD

## 2016-06-30 NOTE — ED Triage Notes (Signed)
Pt brought in by [facility] from home for blood in stools x 2 days. Per EMS blood is dark red. EMS states that pt is not having any abdominal pain, pt states that he is having pain but it is not consistent. Pt denies N/V/D.

## 2016-06-30 NOTE — H&P (Signed)
[facility] [place] [facility]   PATIENT NAME: Stephen [name]    MR#:  [id number]  DATE OF BIRTH:  10/01/1926  DATE OF ADMISSION:  06/30/2016  PRIMARY CARE PHYSICIAN: [facility], [facility]   REQUESTING/REFERRING PHYSICIAN: McShane  CHIEF COMPLAINT:   Chief Complaint  Patient presents with  . Blood In Stools  . Abdominal Pain    HISTORY OF PRESENT ILLNESS: Stephen [name]  is a 83 y.o. male with a known history of Diabetes, hypertension, MI, stroke- is taking aspirin and Plavix for stroke prevention, and take ibuprofen 800 mg twice a day as well as prescription for his joint pains. Last night he had 2-3 bowel movements with blood in there and in the morning wife noted some stool on the floor in his room with blood clot and so they brought him to the emergency room. Patient said every time before bowel movement he was having pain in his lower abdomen. Denies any associated vomiting or nausea and denies any similar episodes in the past. He did not had colonoscopy in the past. And denies acid reflux or epigastric burning pain.  PAST MEDICAL HISTORY:   Past Medical History:  Diagnosis Date  . Diabetes mellitus without complication (HCC)   . Hypertension   . MI (myocardial infarction) (HCC)   . Stroke (cerebrum) (HCC)     PAST SURGICAL HISTORY: History reviewed. No pertinent surgical history.  SOCIAL HISTORY:  Social History  Substance Use Topics  . Smoking status: Former Games developer  . Smokeless tobacco: Never Used  . Alcohol use No    FAMILY HISTORY:  Family History  Problem Relation Age of Onset  . Hypertension Other     DRUG ALLERGIES: No Known Allergies  REVIEW OF SYSTEMS:   CONSTITUTIONAL: No fever, fatigue or weakness.  EYES: No blurred or double vision.  EARS, NOSE, AND THROAT: No tinnitus or ear pain.  RESPIRATORY: No cough, shortness of breath, wheezing or hemoptysis.  CARDIOVASCULAR: No chest pain, orthopnea, edema.  GASTROINTESTINAL:  No nausea, vomiting, diarrhea ,Had some abdominal pain with blood in the stool.  GENITOURINARY: No dysuria, hematuria.  ENDOCRINE: No polyuria, nocturia,  HEMATOLOGY: No anemia, easy bruising or bleeding SKIN: No rash or lesion. MUSCULOSKELETAL: No joint pain or arthritis.   NEUROLOGIC: No tingling, numbness, weakness.  PSYCHIATRY: No anxiety or depression.   MEDICATIONS AT HOME:  Prior to Admission medications   Medication Sig Start Date End Date Taking? Authorizing Provider  amLODipine (NORVASC) 10 MG tablet Take 10 mg by mouth daily. 05/09/16  Yes [provider]  aspirin EC 81 MG tablet Take 1 tablet (81 mg total) by mouth daily. 05/11/16  Yes Wieting, Richard, MD  atorvastatin (LIPITOR) 40 MG tablet Take 40 mg by mouth daily. 04/11/16  Yes [provider]  clopidogrel (PLAVIX) 75 MG tablet Take 1 tablet (75 mg total) by mouth daily. 05/11/16  Yes Wieting, Richard, MD  ferrous sulfate 325 (65 FE) MG tablet Take 325 mg by mouth daily with breakfast.   Yes [provider]  IBU 800 MG tablet Take 800 mg by mouth 2 (two) times daily as needed.  05/09/16  Yes [provider]  lisinopril (PRINIVIL,ZESTRIL) 20 MG tablet Take 20 mg by mouth 2 (two) times daily.  05/09/16  Yes [provider]  metFORMIN (GLUCOPHAGE) 500 MG tablet Take 500 mg by mouth daily. 05/09/16  Yes [provider]      PHYSICAL EXAMINATION:   VITAL SIGNS: Pulse 86, temperature  97.9 F (36.6 C), temperature source Oral, resp. rate 12, SpO2 98 %.  GENERAL:  83 y.o.-year-old patient lying in the bed with no acute distress.  EYES: Pupils equal, round, reactive to light and accommodation. No scleral icterus. Extraocular muscles intact.  HEENT: Head atraumatic, normocephalic. Oropharynx and nasopharynx clear.  NECK:  Supple, no jugular venous distention. No thyroid enlargement, no tenderness.  LUNGS: Normal breath sounds bilaterally, no wheezing, rales,rhonchi or crepitation. No use  of accessory muscles of respiration.  CARDIOVASCULAR: S1, S2 normal. No murmurs, rubs, or gallops.  ABDOMEN: Soft, nontender, nondistended. Bowel sounds present. No organomegaly or mass.  EXTREMITIES: No pedal edema, cyanosis, or clubbing.  NEUROLOGIC: Cranial nerves II through XII are intact. Muscle strength 5/5 in all extremities. Sensation intact. Gait not checked.  PSYCHIATRIC: The patient is alert and oriented x 3.  SKIN: No obvious rash, lesion, or ulcer.   LABORATORY PANEL:   CBC  Recent Labs Lab 06/30/16 0930  WBC 4.9  HGB 11.5*  HCT 34.4*  PLT 192  MCV 85.6  MCH 28.7  MCHC 33.5  RDW 13.6   ------------------------------------------------------------------------------------------------------------------  Chemistries   Recent Labs Lab 06/30/16 0930  NA 142  K 3.7  CL 110  CO2 24  GLUCOSE 121*  BUN 22*  CREATININE 1.13  CALCIUM 8.9  AST 18  ALT 10*  ALKPHOS 79  BILITOT 0.7   ------------------------------------------------------------------------------------------------------------------ CrCl cannot be calculated (Unknown ideal weight.). ------------------------------------------------------------------------------------------------------------------ No results for input(s): TSH, T4TOTAL, T3FREE, THYROIDAB in the last 72 hours.  Invalid input(s): FREET3   Coagulation profile  Recent Labs Lab 06/30/16 0931  INR 1.10   ------------------------------------------------------------------------------------------------------------------- No results for input(s): DDIMER in the last 72 hours. -------------------------------------------------------------------------------------------------------------------  Cardiac Enzymes No results for input(s): CKMB, TROPONINI, MYOGLOBIN in the last 168 hours.  Invalid input(s): CK ------------------------------------------------------------------------------------------------------------------ Invalid input(s):  POCBNP  ---------------------------------------------------------------------------------------------------------------  Urinalysis    Component Value Date/Time   COLORURINE YELLOW (A) 05/09/2016 1939   APPEARANCEUR CLEAR (A) 05/09/2016 1939   APPEARANCEUR Cloudy 08/14/2011 1400   LABSPEC 1.016 05/09/2016 1939   LABSPEC 1.018 08/14/2011 1400   PHURINE 6.0 05/09/2016 1939   GLUCOSEU 150 (A) 05/09/2016 1939   GLUCOSEU >=500 08/14/2011 1400   HGBUR NEGATIVE 05/09/2016 1939   BILIRUBINUR NEGATIVE 05/09/2016 1939   BILIRUBINUR Negative 08/14/2011 1400   KETONESUR NEGATIVE 05/09/2016 1939   PROTEINUR NEGATIVE 05/09/2016 1939   NITRITE NEGATIVE 05/09/2016 1939   LEUKOCYTESUR NEGATIVE 05/09/2016 1939   LEUKOCYTESUR 1+ 08/14/2011 1400     RADIOLOGY: No results found.  EKG: Orders placed or performed during the hospital encounter of 06/30/16  . EKG 12-Lead  . EKG 12-Lead    IMPRESSION AND PLAN:  * GI bleed   He was taking aspirin and Plavix daily for stroke prevention and ibuprofen 800 mg twice daily.   Hold all 3 of them for now, hemoglobin dropped from 13-11, but at this point does not require transfusion we will just monitor.   IV Protonix twice a day and GI consult to see him.   Meanwhile I'll keep him on liquid diet.  * Recent stroke   Hold aspirin and Plavix for now and continue monitoring.  * Hypertension   Continue home medications.  * Diabetes   Hold oral medication as keep on liquid diet and keep on insulin sliding scale coverage.  All the records are reviewed and case discussed with ED provider. Management plans discussed with the patient, family and they are in agreement.  CODE STATUS: DO NOT  RESUSCITATE Code Status History    Date Active Date Inactive Code Status Order ID Comments User Context   05/09/2016  8:12 PM 05/11/2016  4:32 PM Full Code [id number]  Milagros Loll, MD ED     Patient's wife and daughter in the room and I discussed the plan with all of  them.  TOTAL TIME TAKING CARE OF THIS PATIENT:50 minutes.    Altamese Dilling M.D on 06/30/2016   Between 7am to 6pm - Pager - [phone]  After 6pm go to [email or web address] - Social research officer, government  [facility]  Office  [phone]  CC: Primary care physician; Center, [facility]   Note: This dictation was prepared with Dragon dictation along with smaller phrase technology. Any transcriptional errors that result from this process are unintentional.

## 2016-07-01 LAB — CBC
HCT: 25.8 % — ABNORMAL LOW (ref 40.0–52.0)
Hemoglobin: 8.8 g/dL — ABNORMAL LOW (ref 13.0–18.0)
MCH: 29 pg (ref 26.0–34.0)
MCHC: 33.9 g/dL (ref 32.0–36.0)
MCV: 85.4 fL (ref 80.0–100.0)
PLATELETS: 157 10*3/uL (ref 150–440)
RBC: 3.03 MIL/uL — ABNORMAL LOW (ref 4.40–5.90)
RDW: 13.5 % (ref 11.5–14.5)
WBC: 3.9 10*3/uL (ref 3.8–10.6)

## 2016-07-01 LAB — GLUCOSE, CAPILLARY
GLUCOSE-CAPILLARY: 88 mg/dL (ref 65–99)
GLUCOSE-CAPILLARY: 101 mg/dL — AB (ref 65–99)
Glucose-Capillary: 101 mg/dL — ABNORMAL HIGH (ref 65–99)
Glucose-Capillary: 112 mg/dL — ABNORMAL HIGH (ref 65–99)

## 2016-07-01 LAB — BASIC METABOLIC PANEL
Anion gap: 3 — ABNORMAL LOW (ref 5–15)
BUN: 16 mg/dL (ref 6–20)
CALCIUM: 8.2 mg/dL — AB (ref 8.9–10.3)
CHLORIDE: 109 mmol/L (ref 101–111)
CO2: 28 mmol/L (ref 22–32)
CREATININE: 0.97 mg/dL (ref 0.61–1.24)
GFR calc non Af Amer: >60 mL/min (ref >60)
Glucose, Bld: 89 mg/dL (ref 65–99)
Potassium: 3.4 mmol/L — ABNORMAL LOW (ref 3.5–5.1)
SODIUM: 140 mmol/L (ref 135–145)

## 2016-07-01 NOTE — Care Management Obs Status (Signed)
MEDICARE OBSERVATION STATUS NOTIFICATION   Patient Details  Name: Claris GladdenJames L Minium MRN: [id number] Date of Birth: [birth year]   Medicare Observation Status Notification Given:  Yes (MOON letter)    Jolee Ewingockett,Nehal Shives A, RN 07/01/2016, 2:25 PM

## 2016-07-01 NOTE — Progress Notes (Signed)
[facility] [place] [facility]   PATIENT NAME: Stephen [name]    MR#:  [id number]  DATE OF BIRTH:  09-Mar-1926  SUBJECTIVE:  Came in with maroon stools./ no vomtiing Wife int he room  REVIEW OF SYSTEMS:   Review of Systems  Constitutional: Negative for chills, fever and weight loss.  HENT: Negative for ear discharge, ear pain and nosebleeds.   Eyes: Negative for blurred vision, pain and discharge.  Respiratory: Negative for sputum production, shortness of breath, wheezing and stridor.   Cardiovascular: Negative for chest pain, palpitations, orthopnea and PND.  Gastrointestinal: Positive for blood in stool and melena. Negative for abdominal pain, diarrhea, nausea and vomiting.  Genitourinary: Negative for frequency and urgency.  Musculoskeletal: Negative for back pain and joint pain.  Neurological: Positive for weakness. Negative for sensory change, speech change and focal weakness.  Psychiatric/Behavioral: Negative for depression and hallucinations. The patient is not nervous/anxious.    Tolerating Diet:yes Tolerating PT:   DRUG ALLERGIES:  No Known Allergies  VITALS:  Blood pressure 110/63, pulse 67, temperature 98.6 F (37 C), temperature source Oral, resp. rate 18, height 6' (1.829 m), weight 91.2 kg (201 lb), SpO2 98 %.  PHYSICAL EXAMINATION:   Physical Exam  GENERAL:  83 y.o.-year-old patient lying in the bed with no acute distress.  EYES: Pupils equal, round, reactive to light and accommodation. No scleral icterus. Extraocular muscles intact.  HEENT: Head atraumatic, normocephalic. Oropharynx and nasopharynx clear.  NECK:  Supple, no jugular venous distention. No thyroid enlargement, no tenderness.  LUNGS: Normal breath sounds bilaterally, no wheezing, rales, rhonchi. No use of accessory muscles of respiration.  CARDIOVASCULAR: S1, S2 normal. No murmurs, rubs, or gallops.  ABDOMEN: Soft, nontender, nondistended. Bowel sounds present. No  organomegaly or mass.  EXTREMITIES: No cyanosis, clubbing or edema b/l.    NEUROLOGIC: Cranial nerves II through XII are intact. No focal Motor or sensory deficits b/l.   PSYCHIATRIC:  patient is alert and oriented x 3.  SKIN: No obvious rash, lesion, or ulcer.   LABORATORY PANEL:  CBC  Recent Labs Lab 07/01/16 0446  WBC 3.9  HGB 8.8*  HCT 25.8*  PLT 157    Chemistries   Recent Labs Lab 06/30/16 0930 07/01/16 0446  NA 142 140  K 3.7 3.4*  CL 110 109  CO2 24 28  GLUCOSE 121* 89  BUN 22* 16  CREATININE 1.13 0.97  CALCIUM 8.9 8.2*  AST 18  --   ALT 10*  --   ALKPHOS 79  --   BILITOT 0.7  --    Cardiac Enzymes No results for input(s): TROPONINI in the last 168 hours. RADIOLOGY:  No results found. ASSESSMENT AND PLAN:   Stephen [name] is a 83 y.o. male with a known history of Diabetes, hypertension, MI, stroke who is taking aspirin and Plavix for stroke prevention, and admits to taking ibuprofen 800 mg twice a day for arthritis.  * GI bleed   He was taking aspirin and Plavix daily for stroke prevention and ibuprofen 800 mg twice daily.   Hold all 3 of them for now, hemoglobin dropped from 13-11, but at this point does not require transfusion we will just monitor.   IV Protonix twice a day -GI consult appreciated EGD in am  * Recent stroke   Hold aspirin and Plavix for now and continue monitoring.  * Hypertension   Continue home medications.  * Diabetes   Hold oral medication as keep on  liquid diet and keep on insulin sliding scale coverage.  Case discussed with Care Management/Social Worker. Management plans discussed with the patient, family and they are in agreement.  CODE STATUS: **full  DVT Prophylaxis: scd*  TOTAL TIME TAKING CARE OF THIS PATIENT: 25 minutes.  >50% time spent on counselling and coordination of care  POSSIBLE D/C IN **1 DAYS, DEPENDING ON CLINICAL CONDITION.  Note: This dictation was prepared with Dragon dictation along with  smaller phrase technology. Any transcriptional errors that result from this process are unintentional.  Shaolin Armas M.D on 07/01/2016 at 4:45 PM  Between 7am to 6pm - Pager - [phone]  After 6pm go to [email or web address] - Social research officer, governmentpassword [email or web address]  [facility]  Office  [phone]  CC: Primary care physician; [facility]

## 2016-07-01 NOTE — Consult Note (Signed)
Consultation  Referring Provider:      Primary Care Physician:  Primary Gastroenterologist: Lacey Jensen  MD Reason for Consultation:   GI bleed       Impression / Plan:   GI bleed: Possible upper source. Will plan on EGD in am to exclude PUD or AVM. Continue Protonix 40 mg IV q 12.          HPI:   Stephen [name] is a 83 y.o. male  with a known history of Diabetes, hypertension, MI, stroke who is taking aspirin and Plavix for stroke prevention, and admits to taking ibuprofen 800 mg twice a day for arthritis.  Last night he had 2-3 bowel movements with blood in there and in the morning wife noted some stool on the floor in his room with dark blood clot and so they brought him to the emergency room. Patient said every time before bowel movement he was having pain in his lower abdomen. Denies any associated vomiting or nausea and denies any similar episodes in the past. He did not had colonoscopy in the past. And denies acid reflux or epigastric burning pain.  Since admission was started on Protonix 40 mg q 12. ASA, Plavix and Ibuprofen have been stopped.   HGB 13.8 baseline to 11.5 on admission to 8.8 this am   Past Medical History:  Diagnosis Date  . Diabetes mellitus without complication (HCC)   . Hypertension   . MI (myocardial infarction) (HCC)   . Stroke (cerebrum) [facility])     History reviewed. No pertinent surgical history.  Family History  Problem Relation Age of Onset  . Hypertension Other       Social History  Substance Use Topics  . Smoking status: Former Games developer  . Smokeless tobacco: Never Used  . Alcohol use No    Prior to Admission medications   Medication Sig Start Date End Date Taking? Authorizing Provider  amLODipine (NORVASC) 10 MG tablet Take 10 mg by mouth daily. 05/09/16  Yes [provider]  aspirin EC 81 MG tablet Take 1 tablet (81 mg total) by mouth daily. 05/11/16  Yes Wieting, Richard, MD  atorvastatin (LIPITOR) 40 MG tablet Take 40 mg by  mouth daily. 04/11/16  Yes [provider]  clopidogrel (PLAVIX) 75 MG tablet Take 1 tablet (75 mg total) by mouth daily. 05/11/16  Yes Wieting, Richard, MD  ferrous sulfate 325 (65 FE) MG tablet Take 325 mg by mouth daily with breakfast.   Yes [provider]  IBU 800 MG tablet Take 800 mg by mouth 2 (two) times daily as needed.  05/09/16  Yes [provider]  lisinopril (PRINIVIL,ZESTRIL) 20 MG tablet Take 20 mg by mouth 2 (two) times daily.  05/09/16  Yes [provider]  metFORMIN (GLUCOPHAGE) 500 MG tablet Take 500 mg by mouth daily. 05/09/16  Yes [provider]    Current Facility-Administered Medications  Medication Dose Route Frequency Provider Last Rate Last Dose  . amLODipine (NORVASC) tablet 10 mg  10 mg Oral Daily Altamese Dilling, MD   10 mg at 07/01/16 1005  . atorvastatin (LIPITOR) tablet 40 mg  40 mg Oral Daily Altamese Dilling, MD   40 mg at 06/30/16 2347  . docusate sodium (COLACE) capsule 100 mg  100 mg Oral BID PRN Altamese Dilling, MD      . ferrous sulfate tablet 325 mg  325 mg Oral Q breakfast Altamese Dilling, MD   325 mg at 07/01/16 1005  . insulin aspart (  novoLOG) injection 0-9 Units  0-9 Units Subcutaneous TID WC Altamese DillingVachhani, Vaibhavkumar, MD   2 Units at 06/30/16 1706  . lisinopril (PRINIVIL,ZESTRIL) tablet 20 mg  20 mg Oral BID Altamese DillingVachhani, Vaibhavkumar, MD   20 mg at 07/01/16 1005  . pantoprazole (PROTONIX) injection 40 mg  40 mg Intravenous Q12H Altamese DillingVachhani, Vaibhavkumar, MD   40 mg at 07/01/16 1005    Allergies as of 06/30/2016  . (No Known Allergies)     Review of Systems:    This is positive for those things mentioned in the HPI,  .       Physical Exam:  Vital signs in last 24 hours: Temp:  [97.6 F (36.4 C)-98.8 F (37.1 C)] 98.6 F (37 C) (05/26 0449) Pulse Rate:  [61-89] 67 (05/26 0449) Resp:  [16-20] 18 (05/26 0449) BP: (110-136)/(63-75) 110/63 (05/26 0449) SpO2:  [96 %-99 %] 98 % (05/26  0449) Weight:  [91.2 kg (201 lb)] 91.2 kg (201 lb) (05/25 1229) Last BM Date: 07/01/16  General:  Well-developed, well-nourished and in no acute distress Eyes:  anicteric. ENT:   Mouth and posterior pharynx free of lesions.  Neck:   supple w/o thyromegaly or mass.  Lungs: Clear to auscultation bilaterally. Heart:   S1S2, no rubs, murmurs, gallops. Abdomen:  soft, non-tender, no hepatosplenomegaly, hernia, or mass and BS+.  Rectal: Lymph:  no cervical or supraclavicular adenopathy. Extremities:   no edema Skin   no rash. Neuro:  A&O x 3.  Psych:  appropriate mood and  Affect.   Data Reviewed:   LAB RESULTS:  Recent Labs  06/30/16 0930 07/01/16 0446  WBC 4.9 3.9  HGB 11.5* 8.8*  HCT 34.4* 25.8*  PLT 192 157   BMET  Recent Labs  06/30/16 0930 07/01/16 0446  NA 142 140  K 3.7 3.4*  CL 110 109  CO2 24 28  GLUCOSE 121* 89  BUN 22* 16  CREATININE 1.13 0.97  CALCIUM 8.9 8.2*   LFT  Recent Labs  06/30/16 0930  PROT 6.4*  ALBUMIN 3.8  AST 18  ALT 10*  ALKPHOS 79  BILITOT 0.7   PT/INR  Recent Labs  06/30/16 0931  LABPROT 14.2  INR 1.10    STUDIES: No results found.   PREVIOUS ENDOSCOPIES:                Thanks Lacey JensenSteven Woodie Trusty MD   LOS: 0 days   @Carl  Sena SlateE. Gessner, MD, [facility] @  07/01/2016, 11:02 AM

## 2016-07-02 ENCOUNTER — Encounter
Admission: EM | Disposition: A | Discharge status: Home / Self Care | Payer: Self-pay | Source: Home / Self Care | Attending: Emergency Medicine

## 2016-07-02 ENCOUNTER — Encounter: Payer: Self-pay | Admitting: Anesthesiology

## 2016-07-02 ENCOUNTER — Observation Stay: Payer: Medicare Other | Admitting: Anesthesiology

## 2016-07-02 HISTORY — PX: ESOPHAGOGASTRODUODENOSCOPY: SHX5428

## 2016-07-02 LAB — CBC WITH DIFFERENTIAL/PLATELET
Basophils Absolute: 0 10*3/uL (ref 0–0.1)
Basophils Relative: 1 %
EOS ABS: 0.1 10*3/uL (ref 0–0.7)
Eosinophils Relative: 3 %
HEMATOCRIT: 27.8 % — AB (ref 40.0–52.0)
Hemoglobin: 9.2 g/dL — ABNORMAL LOW (ref 13.0–18.0)
LYMPHS ABS: 1 10*3/uL (ref 1.0–3.6)
Lymphocytes Relative: 24 %
MCH: 28.3 pg (ref 26.0–34.0)
MCHC: 33.1 g/dL (ref 32.0–36.0)
MCV: 85.5 fL (ref 80.0–100.0)
MONOS PCT: 8 %
Monocytes Absolute: 0.3 10*3/uL (ref 0.2–1.0)
NEUTROS ABS: 2.8 10*3/uL (ref 1.4–6.5)
NEUTROS PCT: 64 %
Platelets: 166 10*3/uL (ref 150–440)
RBC: 3.25 MIL/uL — AB (ref 4.40–5.90)
RDW: 13.6 % (ref 11.5–14.5)
WBC: 4.3 10*3/uL (ref 3.8–10.6)

## 2016-07-02 LAB — GLUCOSE, CAPILLARY: Glucose-Capillary: 93 mg/dL (ref 65–99)

## 2016-07-02 SURGERY — EGD (ESOPHAGOGASTRODUODENOSCOPY)
Anesthesia: General

## 2016-07-02 MED ORDER — LIDOCAINE HCL 2 % IJ SOLN
INTRAMUSCULAR | Status: AC
  Filled 2016-07-02: qty 10

## 2016-07-02 MED ORDER — PROPOFOL 10 MG/ML IV BOLUS
INTRAVENOUS | Status: DC | PRN
  Administered 2016-07-02: 50 mg via INTRAVENOUS

## 2016-07-02 MED ORDER — PROPOFOL 500 MG/50ML IV EMUL
INTRAVENOUS | Status: DC | PRN
  Administered 2016-07-02: 150 ug/kg/min via INTRAVENOUS

## 2016-07-02 MED ORDER — PANTOPRAZOLE SODIUM 40 MG PO TBEC: 40.0000 mg | DELAYED_RELEASE_TABLET | Freq: Every day | ORAL | 1 refills | Status: AC

## 2016-07-02 MED ORDER — SUCCINYLCHOLINE CHLORIDE 20 MG/ML IJ SOLN
INTRAMUSCULAR | Status: AC
  Filled 2016-07-02: qty 1

## 2016-07-02 MED ORDER — PANTOPRAZOLE SODIUM 40 MG PO TBEC
40.0000 mg | DELAYED_RELEASE_TABLET | Freq: Two times a day (BID) | ORAL | Status: DC
  Administered 2016-07-02: 40 mg via ORAL
  Filled 2016-07-02: qty 1

## 2016-07-02 MED ORDER — GLYCOPYRROLATE 0.2 MG/ML IJ SOLN
INTRAMUSCULAR | Status: AC
  Filled 2016-07-02: qty 1

## 2016-07-02 MED ORDER — PROPOFOL 10 MG/ML IV BOLUS
INTRAVENOUS | Status: AC
  Filled 2016-07-02: qty 40

## 2016-07-02 MED ORDER — SODIUM CHLORIDE 0.9 % IV SOLN
INTRAVENOUS | Status: DC
  Administered 2016-07-02: 08:00:00 via INTRAVENOUS

## 2016-07-02 NOTE — Op Note (Signed)
[facility] Patient Name: Stephen [name] Procedure Date: 07/02/2016 8:01 AM MRN: [id number] Account #: [id number] Date of Birth: 04-26-1926 Admit Type: Inpatient Age: 83 Room: [facility] ENDO ROOM 4 Gender: Male Note Status: Finalized Procedure:            Upper GI endoscopy Indications:          Melena. HGB 8.8. Patient was taking ASA, Plavix and                        ibuprofen. Providers:            Lacey Jensen MD Medicines:            Propofol per Anesthesia Complications:        No immediate complications. Procedure:            Pre-Anesthesia Assessment:                       - Prior to the procedure, a History and Physical was                        performed, and patient medications and allergies were                        reviewed. The patient's tolerance of previous                        anesthesia was also reviewed. The risks and benefits of                        the procedure and the sedation options and risks were                        discussed with the patient. All questions were                        answered, and informed consent was obtained. Prior                        Anticoagulants: The patient last took aspirin 2 days                        and Plavix (clopidogrel) 2 days prior to the procedure.                        ASA Grade Assessment: II - A patient with mild systemic                        disease. After reviewing the risks and benefits, the                        patient was deemed in satisfactory condition to undergo                        the procedure.                       After obtaining informed consent, the endoscope was  passed under direct vision. Throughout the procedure,                        the patient's blood pressure, pulse, and oxygen                        saturations were monitored continuously. The Endoscope                        was introduced through the mouth, and advanced to  the                        second part of duodenum. The upper GI endoscopy was                        accomplished without difficulty. The patient tolerated                        the procedure well. Findings:      The hypopharynx was normal.      The examined esophagus was normal.      A mild stenosis with erythema and congesion was found at the pylorus.       This was traversed and slightly dilated with scope. Biopsies were taken       with a cold forceps for Helicobacter pylori testing.      Proximal portion of third portion of the duodenum was normal. Impression:           - Normal hypopharynx.                       - Normal esophagus.                       - Mild pyloric stenosis was found at the pylorus                        related to NSAIDS..                       - Normal third portion of the duodenum. Recommendation:       - Patient has a contact number available for                        emergencies. The signs and symptoms of potential                        delayed complications were discussed with the patient.                        Return to normal activities tomorrow. Written discharge                        instructions were provided to the patient.                       - Resume regular diet.                       - Use Protonix (pantoprazole) 40 mg PO daily.                       -  No ibuprofen, naproxen, or other non-steroidal                        anti-inflammatory drugs.                       - Check hemogram with white blood cell count and                        platelets today. If HGB stable can be discharged home                        on ASA and Plavix.                       - Follow up with PCP this week.                       - Will need to consider outpatient colonoscopy Procedure Code(s):    --- Professional ---                       [id number], Esophagogastroduodenoscopy, flexible, transoral;                        with biopsy, single or multiple Diagnosis  Code(s):    --- Professional ---                       K92.1, Melena (includes Hematochezia) CPT copyright 2016 American Medical Association. All rights reserved. The codes documented in this report are preliminary and upon coder review may  be revised to meet current compliance requirements. Lacey Jensen, MD Lacey Jensen MD,  07/02/2016 8:37:19 AM Number of Addenda: 0 Note Initiated On: 07/02/2016 8:01 AM      [facility]

## 2016-07-02 NOTE — Transfer of Care (Signed)
Immediate Anesthesia Transfer of Care Note  Patient: Stephen [name]  Procedure(s) Performed: Procedure(s): ESOPHAGOGASTRODUODENOSCOPY (EGD) (N/A)  Patient Location: PACU and Endoscopy Unit  Anesthesia Type:General  Level of Consciousness: sedated  Airway & Oxygen Therapy: Patient Spontanous Breathing and Patient connected to nasal cannula oxygen  Post-op Assessment: Report given to RN and Post -op Vital signs reviewed and stable  Post vital signs: Reviewed and stable  Last Vitals:  Vitals:   07/02/16 0625 07/02/16 0831  BP: 122/67 119/71  Pulse: 71   Resp: 18 (!) 24  Temp: 36.8 C 36.1 C    Complications: No apparent anesthesia complications

## 2016-07-02 NOTE — OR Nursing (Signed)
Patient mentioned he had some pain in his left leg

## 2016-07-02 NOTE — Anesthesia Preprocedure Evaluation (Addendum)
Anesthesia Evaluation  Patient identified by MRN, date of birth, ID band Patient awake    Reviewed: Allergy & Precautions, NPO status , Patient's Chart, lab work & pertinent test results, reviewed documented beta blocker date and time   Airway Mallampati: II  TM Distance: >3 FB     Dental  (+) Chipped   Pulmonary former smoker,           Cardiovascular hypertension, Pt. on medications + Past MI       Neuro/Psych TIACVA    GI/Hepatic   Endo/Other  diabetes, Type 2  Renal/GU      Musculoskeletal   Abdominal   Peds  Hematology   Anesthesia Other Findings EKG noted. Pt has no cardiac symptoms. Will proceed.  Reproductive/Obstetrics                            Anesthesia Physical Anesthesia Plan  ASA: III  Anesthesia Plan: General   Post-op Pain Management:    Induction: Intravenous  Airway Management Planned:   Additional Equipment:   Intra-op Plan:   Post-operative Plan:   Informed Consent: I have reviewed the patients History and Physical, chart, labs and discussed the procedure including the risks, benefits and alternatives for the proposed anesthesia with the patient or authorized representative who has indicated his/her understanding and acceptance.     Plan Discussed with: CRNA  Anesthesia Plan Comments:         Anesthesia Quick Evaluation

## 2016-07-02 NOTE — Anesthesia Post-op Follow-up Note (Cosign Needed)
Anesthesia QCDR form completed.        

## 2016-07-02 NOTE — OR Nursing (Signed)
Patient to room post procedure

## 2016-07-02 NOTE — Progress Notes (Signed)
Pt d/c home; d/c instructions reviewed w/ pt; pt understanding was verbalized; IV removed, catheter in tact, gauze dressing applied; all pt questions answered; pt verbalized that all pt belongings were accounted for; pt left unit via wheelchair accompanied by staff 

## 2016-07-02 NOTE — Anesthesia Procedure Notes (Signed)
Date/Time: 07/02/2016 8:08 AM Performed by: Stormy FabianURTIS, Tabias Swayze Pre-anesthesia Checklist: Patient identified, Emergency Drugs available, Suction available and Patient being monitored Patient Re-evaluated:Patient Re-evaluated prior to inductionOxygen Delivery Method: Nasal cannula Intubation Type: IV induction Dental Injury: Teeth and Oropharynx as per pre-operative assessment  Comments: Nasal cannula with etCO2 monitoring

## 2016-07-02 NOTE — Discharge Summary (Signed)
[facility] [place] at [facility]   PATIENT NAME: Stephen [name]    MR#:  [id number]  DATE OF BIRTH:  Jan 07, 1927  DATE OF ADMISSION:  06/30/2016 ADMITTING PHYSICIAN: Altamese Dilling, MD  DATE OF DISCHARGE: 07/02/2016  PRIMARY CARE PHYSICIAN: [facility]    ADMISSION DIAGNOSIS:  Gastrointestinal hemorrhage, unspecified gastrointestinal hemorrhage type [K92.2]  DISCHARGE DIAGNOSIS:   GI Bleed due to acute Gastritis--NSAID induced  SECONDARY DIAGNOSIS:   Past Medical History:  Diagnosis Date  . Diabetes mellitus without complication (HCC)   . Hypertension   . MI (myocardial infarction) (HCC)   . Stroke (cerebrum) [facility])     HOSPITAL COURSE:  MARTELL MCFADYEN a 83 y.o.malewith a known history of Diabetes, hypertension, MI, stroke who istaking aspirin and Plavix for stroke prevention, and admits totakingibuprofen 800 mg twice a day for arthritis.  * GI bleed due to gastritis suspected NSAID induced He was taking aspirin and Plavix daily for stroke prevention and ibuprofen 800 mg twice daily. resume asa and plavix Stop ibuprofen -hgb 11---8.8--9.2 -IV Protonix twice a day---to po daily -GI consult appreciated EGD  Normal hypopharynx.                       - Normal esophagus.                       - Mild pyloric stenosis was found at the pylorus                        related to NSAIDS..                       - Normal third portion of the duodenum -Colonoscopy as out pt  * Recent stroke resume aspirin and Plavix for now and continue monitoring.  * Hypertension Continue home medications.  * Diabetes resume home meds  D/c home D/w pt  CONSULTS OBTAINED:  Treatment Team:  Lacey Jensen, MD  DRUG ALLERGIES:  No Known Allergies  DISCHARGE MEDICATIONS:   Current Discharge Medication List    START taking these medications   Details  pantoprazole (PROTONIX) 40 MG tablet Take 1 tablet (40 mg  total) by mouth daily. Qty: 30 tablet, Refills: 1      CONTINUE these medications which have NOT CHANGED   Details  amLODipine (NORVASC) 10 MG tablet Take 10 mg by mouth daily.    aspirin EC 81 MG tablet Take 1 tablet (81 mg total) by mouth daily. Qty: 30 tablet, Refills: 0    atorvastatin (LIPITOR) 40 MG tablet Take 40 mg by mouth daily.    clopidogrel (PLAVIX) 75 MG tablet Take 1 tablet (75 mg total) by mouth daily. Qty: 30 tablet, Refills: 0    ferrous sulfate 325 (65 FE) MG tablet Take 325 mg by mouth daily with breakfast.    lisinopril (PRINIVIL,ZESTRIL) 20 MG tablet Take 20 mg by mouth 2 (two) times daily.     metFORMIN (GLUCOPHAGE) 500 MG tablet Take 500 mg by mouth daily.      STOP taking these medications     IBU 800 MG tablet         If you experience worsening of your admission symptoms, develop shortness of breath, life threatening emergency, suicidal or homicidal thoughts you must seek medical attention immediately by calling 911 or calling your MD immediately  if symptoms less severe.  You  Must read complete instructions/literature along with all the possible adverse reactions/side effects for all the Medicines you take and that have been prescribed to you. Take any new Medicines after you have completely understood and accept all the possible adverse reactions/side effects.   Please note  You were cared for by a hospitalist during your hospital stay. If you have any questions about your discharge medications or the care you received while you were in the hospital after you are discharged, you can call the unit and asked to speak with the hospitalist on call if the hospitalist that took care of you is not available. Once you are discharged, your primary care physician will handle any further medical issues. Please note that NO REFILLS for any discharge medications will be authorized once you are discharged, as it is imperative that you return to your primary care  physician (or establish a relationship with a primary care physician if you do not have one) for your aftercare needs so that they can reassess your need for medications and monitor your lab values. Today   SUBJECTIVE   Doing well Wants to go home  VITAL SIGNS:  Blood pressure (!) 124/56, pulse 64, temperature 98.3 F (36.8 C), temperature source Oral, resp. rate 16, height 6' (1.829 m), weight 91.2 kg (201 lb), SpO2 97 %.  I/O:   Intake/Output Summary (Last 24 hours) at 07/02/16 1052 Last data filed at 07/02/16 0831  Gross per 24 hour  Intake              460 ml  Output             1630 ml  Net            -1170 ml    PHYSICAL EXAMINATION:  GENERAL:  83 y.o.-year-old patient lying in the bed with no acute distress.  EYES: Pupils equal, round, reactive to light and accommodation. No scleral icterus. Extraocular muscles intact.  HEENT: Head atraumatic, normocephalic. Oropharynx and nasopharynx clear.  NECK:  Supple, no jugular venous distention. No thyroid enlargement, no tenderness.  LUNGS: Normal breath sounds bilaterally, no wheezing, rales,rhonchi or crepitation. No use of accessory muscles of respiration.  CARDIOVASCULAR: S1, S2 normal. No murmurs, rubs, or gallops.  ABDOMEN: Soft, non-tender, non-distended. Bowel sounds present. No organomegaly or mass.  EXTREMITIES: No pedal edema, cyanosis, or clubbing.  NEUROLOGIC: Cranial nerves II through XII are intact. Muscle strength 5/5 in all extremities. Sensation intact. Gait not checked.  PSYCHIATRIC: The patient is alert and oriented x 3.  SKIN: No obvious rash, lesion, or ulcer.   DATA REVIEW:   CBC   Recent Labs Lab 07/02/16 1014  WBC 4.3  HGB 9.2*  HCT 27.8*  PLT 166    Chemistries   Recent Labs Lab 06/30/16 0930 07/01/16 0446  NA 142 140  K 3.7 3.4*  CL 110 109  CO2 24 28  GLUCOSE 121* 89  BUN 22* 16  CREATININE 1.13 0.97  CALCIUM 8.9 8.2*  AST 18  --   ALT 10*  --   ALKPHOS 79  --   BILITOT 0.7   --     Microbiology Results   No results found for this or any previous visit (from the past 240 hour(s)).  RADIOLOGY:  No results found.   Management plans discussed with the patient, family and they are in agreement.  CODE STATUS:     Code Status Orders        Start  Ordered   06/30/16 1229  Do not attempt resuscitation (DNR)  Continuous    Question Answer Comment  In the event of cardiac or respiratory ARREST Do not call a "code blue"   In the event of cardiac or respiratory ARREST Do not perform Intubation, CPR, defibrillation or ACLS   In the event of cardiac or respiratory ARREST Use medication by any route, position, wound care, and other measures to relive pain and suffering. May use oxygen, suction and manual treatment of airway obstruction as needed for comfort.      06/30/16 1229    Code Status History    Date Active Date Inactive Code Status Order ID Comments User Context   05/09/2016  8:12 PM 05/11/2016  4:32 PM Full Code [id number]  Milagros Loll, MD ED      TOTAL TIME TAKING CARE OF THIS PATIENT: *40* minutes.    Caniyah Murley M.D on 07/02/2016 at 10:52 AM  Between 7am to 6pm - Pager - [phone] After 6pm go to [email or web address] - Social research officer, government  [facility]  Office  [phone]  CC: Primary care physician; Center, [facility]

## 2016-07-02 NOTE — Anesthesia Postprocedure Evaluation (Signed)
Anesthesia Post Note  Patient: Stephen GladdenJames L Aydelotte  Procedure(s) Performed: Procedure(s) (LRB): ESOPHAGOGASTRODUODENOSCOPY (EGD) (N/A)  Patient location during evaluation: PACU Anesthesia Type: General Level of consciousness: awake and alert Pain management: pain level controlled Vital Signs Assessment: post-procedure vital signs reviewed and stable Respiratory status: spontaneous breathing, nonlabored ventilation, respiratory function stable and patient connected to nasal cannula oxygen Cardiovascular status: blood pressure returned to baseline and stable Postop Assessment: no signs of nausea or vomiting Anesthetic complications: no     Last Vitals:  Vitals:   07/02/16 0859 07/02/16 0919  BP: 126/69 (!) 124/56  Pulse:  64  Resp: 20 16  Temp:  36.8 C    Last Pain:  Vitals:   07/02/16 0919  TempSrc: Oral  PainSc: 0-No pain                 [name]

## 2016-07-04 ENCOUNTER — Encounter: Payer: Self-pay | Admitting: Gastroenterology

## 2016-07-05 LAB — SURGICAL PATHOLOGY

## 2018-06-08 ENCOUNTER — Emergency Department
Admission: EM | Admit: 2018-06-08 | Discharge: 2018-06-08 | Disposition: A | Discharge status: Other Acute Inpatient Hospital | Payer: Medicare Other | Attending: Emergency Medicine | Admitting: Emergency Medicine

## 2018-06-08 ENCOUNTER — Other Ambulatory Visit: Payer: Self-pay

## 2018-06-08 ENCOUNTER — Emergency Department: Payer: Medicare Other

## 2018-06-08 DIAGNOSIS — Z8673 Personal history of transient ischemic attack (TIA), and cerebral infarction without residual deficits: Secondary | ICD-10-CM | POA: Diagnosis not present

## 2018-06-08 DIAGNOSIS — Z79899 Other long term (current) drug therapy: Secondary | ICD-10-CM | POA: Diagnosis not present

## 2018-06-08 DIAGNOSIS — E119 Type 2 diabetes mellitus without complications: Secondary | ICD-10-CM | POA: Insufficient documentation

## 2018-06-08 DIAGNOSIS — I10 Essential (primary) hypertension: Secondary | ICD-10-CM | POA: Insufficient documentation

## 2018-06-08 DIAGNOSIS — R4182 Altered mental status, unspecified: Secondary | ICD-10-CM | POA: Diagnosis present

## 2018-06-08 DIAGNOSIS — Z87891 Personal history of nicotine dependence: Secondary | ICD-10-CM | POA: Insufficient documentation

## 2018-06-08 DIAGNOSIS — I619 Nontraumatic intracerebral hemorrhage, unspecified: Secondary | ICD-10-CM | POA: Diagnosis not present

## 2018-06-08 DIAGNOSIS — Z7984 Long term (current) use of oral hypoglycemic drugs: Secondary | ICD-10-CM | POA: Insufficient documentation

## 2018-06-08 LAB — COMPREHENSIVE METABOLIC PANEL
ALT: 18 U/L (ref 0–44)
AST: 22 U/L (ref 15–41)
Albumin: 4 g/dL (ref 3.5–5.0)
Alkaline Phosphatase: 110 U/L (ref 38–126)
Anion gap: 12 (ref 5–15)
BUN: 14 mg/dL (ref 8–23)
CO2: 23 mmol/L (ref 22–32)
Calcium: 8.9 mg/dL (ref 8.9–10.3)
Chloride: 103 mmol/L (ref 98–111)
Creatinine, Ser: 0.93 mg/dL (ref 0.61–1.24)
GFR calc Af Amer: >60 mL/min (ref >60)
GFR calc non Af Amer: >60 mL/min (ref >60)
Glucose, Bld: 104 mg/dL — ABNORMAL HIGH (ref 70–99)
Potassium: 3.7 mmol/L (ref 3.5–5.1)
Sodium: 138 mmol/L (ref 135–145)
Total Bilirubin: 1 mg/dL (ref 0.3–1.2)
Total Protein: 7.1 g/dL (ref 6.5–8.1)

## 2018-06-08 LAB — GLUCOSE, CAPILLARY: Glucose-Capillary: 99 mg/dL (ref 70–99)

## 2018-06-08 LAB — DIFFERENTIAL
Abs Immature Granulocytes: 0.01 10*3/uL (ref 0.00–0.07)
Basophils Absolute: 0 10*3/uL (ref 0.0–0.1)
Basophils Relative: 0 %
Eosinophils Absolute: 0.1 10*3/uL (ref 0.0–0.5)
Eosinophils Relative: 2 %
Immature Granulocytes: 0 %
Lymphocytes Relative: 33 %
Lymphs Abs: 1.7 10*3/uL (ref 0.7–4.0)
Monocytes Absolute: 0.3 10*3/uL (ref 0.1–1.0)
Monocytes Relative: 7 %
Neutro Abs: 2.9 10*3/uL (ref 1.7–7.7)
Neutrophils Relative %: 58 %

## 2018-06-08 LAB — CBC
HCT: 41.9 % (ref 39.0–52.0)
Hemoglobin: 13.9 g/dL (ref 13.0–17.0)
MCH: 28 pg (ref 26.0–34.0)
MCHC: 33.2 g/dL (ref 30.0–36.0)
MCV: 84.5 fL (ref 80.0–100.0)
Platelets: 208 10*3/uL (ref 150–400)
RBC: 4.96 MIL/uL (ref 4.22–5.81)
RDW: 14.1 % (ref 11.5–15.5)
WBC: 5.1 10*3/uL (ref 4.0–10.5)
nRBC: 0 % (ref 0.0–0.2)

## 2018-06-08 LAB — PROTIME-INR
INR: 1 (ref 0.8–1.2)
Prothrombin Time: 12.9 seconds (ref 11.4–15.2)

## 2018-06-08 LAB — APTT: aPTT: 29 seconds (ref 24–36)

## 2018-06-08 MED ORDER — NICARDIPINE HCL IN NACL 20-0.86 MG/200ML-% IV SOLN
0.0000 mg/h | INTRAVENOUS | Status: DC
  Administered 2018-06-08: 5 mg/h via INTRAVENOUS
  Filled 2018-06-08: qty 200

## 2018-06-08 MED ORDER — SODIUM CHLORIDE 0.9% FLUSH
3.0000 mL | Freq: Once | INTRAVENOUS | Status: DC
Start: 2018-06-08 — End: 2018-06-08

## 2018-06-08 MED ORDER — LABETALOL HCL 5 MG/ML IV SOLN
10.0000 mg | Freq: Once | INTRAVENOUS | Status: AC
  Administered 2018-06-08: 17:00:00 10 mg via INTRAVENOUS
  Filled 2018-06-08: qty 4

## 2018-06-08 NOTE — ED Notes (Signed)
Family called EMS, per family LKW 1250pm.

## 2018-06-08 NOTE — ED Notes (Signed)
This RN has contacted family to provide update in regards to pt's status.

## 2018-06-08 NOTE — ED Notes (Signed)
emtala reviewed by this RN 

## 2018-06-08 NOTE — ED Notes (Signed)
EDP Stafford at bedside to provide update to pt.

## 2018-06-08 NOTE — ED Triage Notes (Signed)
Pt arrives [facility] from home for sudden onset confusion and incontinence that began about 2 hours ago. Pt arrives altered. Hx stroke, with R side deficit. Arrives with urine noted to pants. CBG 94. VSS. 18G L hand.

## 2018-06-08 NOTE — ED Notes (Signed)
Dr. Scotty Court contacted pt's wife, Prynceton Holtzinger to provide update on pt's status.

## 2018-06-08 NOTE — ED Provider Notes (Signed)
[facility] Emergency Department Provider Note  ____________________________________________  Time seen: Approximately 3:14 PM  I have reviewed the triage vital signs and the nursing notes.   HISTORY  Chief Complaint Altered Mental Status    Level 5 Caveat: Portions of the History and Physical including HPI and review of systems are unable to be completely obtained due to patient having altered mental status / confusion   HPI JOHNWILLIAM [name] is a 85 y.o. male with a history of hypertension diabetes prior stroke and CAD who was in his usual state of health until at 12:30 PM when he had a sudden onset of right sided weakness, confusion, urinary incontinence.  No syncope or fall.  Patient denies any complaints, but is obviously confused.  No recent falls or head trauma.  Symptoms been constant since onset without aggravating or alleviating factors.      Past Medical History:  Diagnosis Date  . Diabetes mellitus without complication (HCC)   . Hypertension   . MI (myocardial infarction) (HCC)   . Stroke (cerebrum) [facility])      Patient Active Problem List   Diagnosis Date Noted  . GI bleed 06/30/2016  . TIA (transient ischemic attack) 05/11/2016  . Dysarthria 05/09/2016     Past Surgical History:  Procedure Laterality Date  . ESOPHAGOGASTRODUODENOSCOPY N/A 07/02/2016   Procedure: ESOPHAGOGASTRODUODENOSCOPY (EGD);  Surgeon: Lacey Jensen, MD;  Location: [facility] ENDOSCOPY;  Service: Gastroenterology;  Laterality: N/A;     Prior to Admission medications   Medication Sig Start Date End Date Taking? Authorizing Provider  amLODipine (NORVASC) 10 MG tablet Take 10 mg by mouth daily. 05/09/16  Yes [provider]  aspirin EC 81 MG tablet Take 1 tablet (81 mg total) by mouth daily. 05/11/16  Yes Wieting, Richard, MD  atorvastatin (LIPITOR) 40 MG tablet Take 40 mg by mouth daily. 04/11/16  Yes [provider]  clopidogrel (PLAVIX) 75 MG tablet Take 1  tablet (75 mg total) by mouth daily. 05/11/16  Yes Wieting, Richard, MD  ferrous sulfate 325 (65 FE) MG tablet Take 325 mg by mouth daily with breakfast.   Yes [provider]  lisinopril (PRINIVIL,ZESTRIL) 20 MG tablet Take 20 mg by mouth daily.  05/09/16  Yes [provider]  metFORMIN (GLUCOPHAGE) 500 MG tablet Take 500 mg by mouth daily. 05/09/16  Yes [provider]  pantoprazole (PROTONIX) 40 MG tablet Take 1 tablet (40 mg total) by mouth daily. 07/02/16  Yes Enedina Finner, MD     Allergies Patient has no known allergies.   Family History  Problem Relation Age of Onset  . Hypertension Other     Social History Social History   Tobacco Use  . Smoking status: Former Games developer  . Smokeless tobacco: Never Used  Substance Use Topics  . Alcohol use: No  . Drug use: No    Review of Systems Level 5 Caveat: Portions of the History and Physical including HPI and review of systems are unable to be completely obtained due to patient being confused   Constitutional:   No known fever.  ENT:   No rhinorrhea. Cardiovascular:   No chest pain or syncope. Respiratory:   No dyspnea or cough. Gastrointestinal:   Negative for abdominal pain, vomiting and diarrhea.  Musculoskeletal:   Negative for focal pain or swelling ____________________________________________   PHYSICAL EXAM:  VITAL SIGNS: ED Triage Vitals  Enc Vitals Group     BP 06/08/18 1505 (!) 147/76     Pulse Rate  06/08/18 1501 81     Resp 06/08/18 1501 19     Temp 06/08/18 1501 97.9 F (36.6 C)     Temp Source 06/08/18 1501 Oral     SpO2 06/08/18 1501 97 %     Weight 06/08/18 1503 230 lb (104.3 kg)     Height 06/08/18 1503 6' (1.829 m)     Head Circumference --      Peak Flow --      Pain Score --      Pain Loc --      Pain Edu? --      Excl. in GC? --     Vital signs reviewed, nursing assessments reviewed.   Constitutional: Awake, not oriented. Non-toxic appearance. Eyes:   Conjunctivae are  normal. EOMI. PERRL. ENT      Head:   Normocephalic and atraumatic.      Nose:   No congestion/rhinnorhea.       Mouth/Throat:   MMM, no pharyngeal erythema. No peritonsillar mass.       Neck:   No meningismus. Full ROM. Hematological/Lymphatic/Immunilogical:   No cervical lymphadenopathy. Cardiovascular:   RRR. Symmetric bilateral radial and DP pulses.  No murmurs. Cap refill less than 2 seconds. Respiratory:   Normal respiratory effort without tachypnea/retractions. Breath sounds are clear and equal bilaterally. No wheezes/rales/rhonchi. Gastrointestinal:   Soft and nontender. Non distended. There is no CVA tenderness.  No rebound, rigidity, or guarding.  Musculoskeletal:   Normal range of motion in all extremities. No joint effusions.  No lower extremity tenderness.  No edema. Neurologic:   Normal speech, confused.  Cranial nerves III through XII intact Diminished peripheral field on the right 3/5 strength right upper and right lower extremities 5/5 strength left upper and left lower extremities Absent sensation right upper and right lower extremities NIH stroke scale 14 Skin:    Skin is warm, dry and intact. No rash noted.  No petechiae, purpura, or bullae.  ____________________________________________    LABS (pertinent positives/negatives) (all labs ordered are listed, but only abnormal results are displayed) Labs Reviewed  COMPREHENSIVE METABOLIC PANEL - Abnormal; Notable for the following components:      Result Value   Glucose, Bld 104 (*)    All other components within normal limits  PROTIME-INR  APTT  CBC  DIFFERENTIAL  GLUCOSE, CAPILLARY  CBG MONITORING, ED   ____________________________________________   EKG  Interpreted by me Sinus rhythm rate of 82, left axis, normal intervals.  Poor R wave progression.  Right bundle branch block.  No acute ischemic changes.  ____________________________________________    RADIOLOGY  Ct Head Code Stroke Wo  Contrast  Result Date: 06/08/2018 CLINICAL DATA:  Code stroke.  Acute confusion.  Right-sided deficit EXAM: CT HEAD WITHOUT CONTRAST TECHNIQUE: Contiguous axial images were obtained from the base of the skull through the vertex without intravenous contrast. COMPARISON:  None. FINDINGS: Brain: 6 cc (26 x 18 x 25 mm) high-density hematoma in the left thalamus. No intraventricular extension. There is a background of chronic small vessel ischemic change in the cerebral white matter. Remote right superior cerebellar infarct. Generalized atrophy. Vascular: Atherosclerotic calcification.  No hyperdense vessel. Skull: Negative Sinuses/Orbits: Negative Other: Critical Value/emergent results were called by telephone at the time of interpretation on 06/08/2018 at 3:25 pm to Dr. Sharman Cheek , who verbally acknowledged these results. IMPRESSION: 1. 6 cc acute hematoma in the left thalamus. 2. Chronic small vessel ischemia and remote right superior cerebellar infarct. Electronically Signed   By:  Marnee SpringJonathon  Watts M.D.   On: 06/08/2018 15:27    ____________________________________________   PROCEDURES .Critical Care Performed by: Sharman CheekStafford, Oden Lindaman, MD Authorized by: Sharman CheekStafford, Manoj Enriquez, MD   Critical care provider statement:    Critical care time (minutes):  35   Critical care time was exclusive of:  Separately billable procedures and treating other patients   Critical care was necessary to treat or prevent imminent or life-threatening deterioration of the following conditions:  CNS failure or compromise   Critical care was time spent personally by me on the following activities:  Development of treatment plan with patient or surrogate, discussions with consultants, evaluation of patient's response to treatment, examination of patient, obtaining history from patient or surrogate, ordering and performing treatments and interventions, ordering and review of laboratory studies, ordering and review of radiographic  studies, pulse oximetry, re-evaluation of patient's condition and review of old charts    ____________________________________________  DIFFERENTIAL DIAGNOSIS   Acute ischemic stroke, hemorrhagic stroke, seizure/postictal, ACS, dehydration, electrolyte disturbance  CLINICAL IMPRESSION / ASSESSMENT AND PLAN / ED COURSE  Pertinent labs & imaging results that were available during my care of the patient were reviewed by me and considered in my medical decision making (see chart for details).   Claris GladdenJames L Lenhardt was evaluated in Emergency Department on 06/08/2018 for the symptoms described in the history of present illness. He was evaluated in the context of the global COVID-19 pandemic, which necessitated consideration that the patient might be at risk for infection with the SARS-CoV-2 virus that causes COVID-19. Institutional protocols and algorithms that pertain to the evaluation of patients at risk for COVID-19 are in a state of rapid change based on information released by regulatory bodies including the CDC and federal and state organizations. These policies and algorithms were followed during the patient's care in the ED.   Patient presents with acute strokelike syndrome.  Code stroke initiated with CT scan, labs.  Clinical Course as of Jun 08 1703  Sat Jun 08, 2018  1514 Pt transported to rads for CT.    [PS]  1540 CT d/w spouse Scarlette CalicoFrances. Full code, requests I do any treatments / procedures I think is best.    [PS]  1549 D/w [facility] who will page [id number]   [PS]  1630 D/w [facility] ICU atdg Dr. Sherryll BurgerShah, who [facility] due to hospital census management. Waiting for contact with [facility]   [PS]  [id number] Notified that pt is being accepted to [facility] and has bed assigned. Awaiting transport. Mental status stable. Responds to voice, answers questions, though confused. Follows commands. Breathing comfortable.  Does not appear to require  intubation for airway protection during transport.   [PS]  1658 Blood pressure remained steady in a range of 1 40-1 50 systolic, so will give 10 mg IV labetalol, start nicardipine if it remains elevated.   [PS]    Clinical Course User Index [PS] Sharman CheekStafford, Quiera Diffee, MD     ____________________________________________   FINAL CLINICAL IMPRESSION(S) / ED DIAGNOSES    Final diagnoses:  Thalamic hemorrhage with stroke [facility](HCC)     ED Discharge Orders    None      Portions of this note were generated with dragon dictation software. Dictation errors may occur despite best attempts at proofreading.   Sharman CheekStafford, Kaydince Towles, MD 06/08/18 1705

## 2018-07-22 ENCOUNTER — Inpatient Hospital Stay: Payer: Medicare Other

## 2018-07-22 ENCOUNTER — Emergency Department: Payer: Medicare Other

## 2018-07-22 ENCOUNTER — Inpatient Hospital Stay
Admission: EM | Admit: 2018-07-22 | Discharge: 2018-08-07 | DRG: 871 | Disposition: E | Payer: Medicare Other | Attending: Internal Medicine | Admitting: Internal Medicine

## 2018-07-22 DIAGNOSIS — I10 Essential (primary) hypertension: Secondary | ICD-10-CM | POA: Diagnosis present

## 2018-07-22 DIAGNOSIS — N390 Urinary tract infection, site not specified: Secondary | ICD-10-CM | POA: Diagnosis present

## 2018-07-22 DIAGNOSIS — J690 Pneumonitis due to inhalation of food and vomit: Secondary | ICD-10-CM | POA: Diagnosis present

## 2018-07-22 DIAGNOSIS — Z8673 Personal history of transient ischemic attack (TIA), and cerebral infarction without residual deficits: Secondary | ICD-10-CM

## 2018-07-22 DIAGNOSIS — E876 Hypokalemia: Secondary | ICD-10-CM | POA: Diagnosis present

## 2018-07-22 DIAGNOSIS — R6521 Severe sepsis with septic shock: Secondary | ICD-10-CM | POA: Diagnosis present

## 2018-07-22 DIAGNOSIS — Z79899 Other long term (current) drug therapy: Secondary | ICD-10-CM | POA: Diagnosis not present

## 2018-07-22 DIAGNOSIS — I252 Old myocardial infarction: Secondary | ICD-10-CM

## 2018-07-22 DIAGNOSIS — Z7902 Long term (current) use of antithrombotics/antiplatelets: Secondary | ICD-10-CM

## 2018-07-22 DIAGNOSIS — Z7189 Other specified counseling: Secondary | ICD-10-CM

## 2018-07-22 DIAGNOSIS — Z7984 Long term (current) use of oral hypoglycemic drugs: Secondary | ICD-10-CM

## 2018-07-22 DIAGNOSIS — Z20828 Contact with and (suspected) exposure to other viral communicable diseases: Secondary | ICD-10-CM | POA: Diagnosis present

## 2018-07-22 DIAGNOSIS — Z515 Encounter for palliative care: Secondary | ICD-10-CM | POA: Diagnosis present

## 2018-07-22 DIAGNOSIS — A419 Sepsis, unspecified organism: Secondary | ICD-10-CM | POA: Diagnosis present

## 2018-07-22 DIAGNOSIS — J9600 Acute respiratory failure, unspecified whether with hypoxia or hypercapnia: Secondary | ICD-10-CM

## 2018-07-22 DIAGNOSIS — Z87891 Personal history of nicotine dependence: Secondary | ICD-10-CM

## 2018-07-22 DIAGNOSIS — Z66 Do not resuscitate: Secondary | ICD-10-CM | POA: Diagnosis present

## 2018-07-22 DIAGNOSIS — J9601 Acute respiratory failure with hypoxia: Secondary | ICD-10-CM | POA: Diagnosis present

## 2018-07-22 DIAGNOSIS — Z7982 Long term (current) use of aspirin: Secondary | ICD-10-CM | POA: Diagnosis not present

## 2018-07-22 DIAGNOSIS — E872 Acidosis, unspecified: Secondary | ICD-10-CM

## 2018-07-22 DIAGNOSIS — Z4659 Encounter for fitting and adjustment of other gastrointestinal appliance and device: Secondary | ICD-10-CM

## 2018-07-22 DIAGNOSIS — E119 Type 2 diabetes mellitus without complications: Secondary | ICD-10-CM | POA: Diagnosis present

## 2018-07-22 DIAGNOSIS — R6520 Severe sepsis without septic shock: Secondary | ICD-10-CM | POA: Diagnosis present

## 2018-07-22 DIAGNOSIS — Z452 Encounter for adjustment and management of vascular access device: Secondary | ICD-10-CM

## 2018-07-22 LAB — URINALYSIS, ROUTINE W REFLEX MICROSCOPIC
Bacteria, UA: NONE SEEN
RBC / HPF: >50 RBC/hpf — ABNORMAL HIGH (ref 0–5)
Specific Gravity, Urine: 1.01 (ref 1.005–1.030)
Squamous Epithelial / HPF: NONE SEEN (ref 0–5)

## 2018-07-22 LAB — MAGNESIUM: Magnesium: 1 mg/dL — ABNORMAL LOW (ref 1.7–2.4)

## 2018-07-22 LAB — BLOOD GAS, ARTERIAL
Acid-base deficit: 2.6 mmol/L — ABNORMAL HIGH (ref 0.0–2.0)
Bicarbonate: 23.7 mmol/L (ref 20.0–28.0)
FIO2: 0.8
MECHVT: 500 mL
O2 Saturation: 97.9 %
PEEP: 5 cmH2O
Patient temperature: 37
RATE: 18 resp/min
pCO2 arterial: 46 mmHg (ref 32.0–48.0)
pH, Arterial: 7.32 — ABNORMAL LOW (ref 7.350–7.450)
pO2, Arterial: 110 mmHg — ABNORMAL HIGH (ref 83.0–108.0)

## 2018-07-22 LAB — CBC WITH DIFFERENTIAL/PLATELET
Abs Immature Granulocytes: 0.01 10*3/uL (ref 0.00–0.07)
Basophils Absolute: 0 10*3/uL (ref 0.0–0.1)
Basophils Relative: 1 %
Eosinophils Absolute: 0 10*3/uL (ref 0.0–0.5)
Eosinophils Relative: 1 %
HCT: 49.8 % (ref 39.0–52.0)
Hemoglobin: 16 g/dL (ref 13.0–17.0)
Immature Granulocytes: 1 %
Lymphocytes Relative: 8 %
Lymphs Abs: 0.2 10*3/uL — ABNORMAL LOW (ref 0.7–4.0)
MCH: 27.5 pg (ref 26.0–34.0)
MCHC: 32.1 g/dL (ref 30.0–36.0)
MCV: 85.7 fL (ref 80.0–100.0)
Monocytes Absolute: 0 10*3/uL — ABNORMAL LOW (ref 0.1–1.0)
Monocytes Relative: 0 %
Neutro Abs: 1.9 10*3/uL (ref 1.7–7.7)
Neutrophils Relative %: 89 %
Platelets: 289 10*3/uL (ref 150–400)
RBC: 5.81 MIL/uL (ref 4.22–5.81)
RDW: 13.4 % (ref 11.5–15.5)
WBC: 2 10*3/uL — ABNORMAL LOW (ref 4.0–10.5)
nRBC: 0 % (ref 0.0–0.2)

## 2018-07-22 LAB — PROTIME-INR
INR: 3.4 — ABNORMAL HIGH (ref 0.8–1.2)
Prothrombin Time: 34 seconds — ABNORMAL HIGH (ref 11.4–15.2)

## 2018-07-22 LAB — COMPREHENSIVE METABOLIC PANEL
ALT: 21 U/L (ref 0–44)
AST: 23 U/L (ref 15–41)
Albumin: 1.5 g/dL — ABNORMAL LOW (ref 3.5–5.0)
Alkaline Phosphatase: 60 U/L (ref 38–126)
Anion gap: 5 (ref 5–15)
BUN: 7 mg/dL — ABNORMAL LOW (ref 8–23)
CO2: 12 mmol/L — ABNORMAL LOW (ref 22–32)
Calcium: 4.2 mg/dL — CL (ref 8.9–10.3)
Chloride: 127 mmol/L — ABNORMAL HIGH (ref 98–111)
Creatinine, Ser: 0.47 mg/dL — ABNORMAL LOW (ref 0.61–1.24)
GFR calc Af Amer: >60 mL/min (ref >60)
GFR calc non Af Amer: >60 mL/min (ref >60)
Glucose, Bld: 72 mg/dL (ref 70–99)
Potassium: 2 mmol/L — CL (ref 3.5–5.1)
Sodium: 144 mmol/L (ref 135–145)
Total Bilirubin: 0.7 mg/dL (ref 0.3–1.2)
Total Protein: 3.1 g/dL — ABNORMAL LOW (ref 6.5–8.1)

## 2018-07-22 LAB — GLUCOSE, CAPILLARY
Glucose-Capillary: 124 mg/dL — ABNORMAL HIGH (ref 70–99)
Glucose-Capillary: 126 mg/dL — ABNORMAL HIGH (ref 70–99)

## 2018-07-22 LAB — SARS CORONAVIRUS 2 BY RT PCR (HOSPITAL ORDER, PERFORMED IN ~~LOC~~ HOSPITAL LAB): SARS Coronavirus 2: NEGATIVE

## 2018-07-22 LAB — LACTIC ACID, PLASMA
Lactic Acid, Venous: 4.5 mmol/L (ref 0.5–1.9)
Lactic Acid, Venous: 1.5 mmol/L (ref 0.5–1.9)

## 2018-07-22 LAB — TROPONIN I: Troponin I: 0.03 ng/mL (ref <0.03)

## 2018-07-22 LAB — LIPASE, BLOOD: Lipase: 22 U/L (ref 11–51)

## 2018-07-22 MED ORDER — SODIUM CHLORIDE 0.9 % IV BOLUS (SEPSIS)
1000.0000 mL | Freq: Once | INTRAVENOUS | Status: AC
  Administered 2018-07-22: 1000 mL via INTRAVENOUS

## 2018-07-22 MED ORDER — VANCOMYCIN HCL 500 MG IV SOLR
500.0000 mg | Freq: Once | INTRAVENOUS | Status: AC
  Administered 2018-07-22: 500 mg via INTRAVENOUS
  Filled 2018-07-22: qty 500

## 2018-07-22 MED ORDER — VANCOMYCIN HCL IN DEXTROSE 1-5 GM/200ML-% IV SOLN
1000.0000 mg | Freq: Once | INTRAVENOUS | Status: DC
  Administered 2018-07-22: 1000 mg via INTRAVENOUS
  Filled 2018-07-22: qty 200

## 2018-07-22 MED ORDER — ROCURONIUM BROMIDE 50 MG/5ML IV SOLN
INTRAVENOUS | Status: AC | PRN
  Administered 2018-07-22: 100 mg via INTRAVENOUS

## 2018-07-22 MED ORDER — CALCIUM GLUCONATE-NACL 1-0.675 GM/50ML-% IV SOLN
1.0000 g | Freq: Once | INTRAVENOUS | Status: AC
  Administered 2018-07-22: 1000 mg via INTRAVENOUS
  Filled 2018-07-22: qty 50

## 2018-07-22 MED ORDER — SODIUM CHLORIDE 0.9 % IV SOLN
INTRAVENOUS | Status: DC | PRN
  Administered 2018-07-22 – 2018-07-23 (×2): 250 mL via INTRAVENOUS

## 2018-07-22 MED ORDER — POTASSIUM CHLORIDE 10 MEQ/100ML IV SOLN
10.0000 meq | INTRAVENOUS | Status: DC
  Filled 2018-07-22 (×2): qty 100

## 2018-07-22 MED ORDER — FENTANYL BOLUS VIA INFUSION
50.0000 ug | INTRAVENOUS | Status: DC | PRN
  Administered 2018-07-23: 100 ug via INTRAVENOUS
  Filled 2018-07-22: qty 100

## 2018-07-22 MED ORDER — FAMOTIDINE IN NACL 20-0.9 MG/50ML-% IV SOLN
20.0000 mg | Freq: Two times a day (BID) | INTRAVENOUS | Status: DC
  Administered 2018-07-23 (×2): 20 mg via INTRAVENOUS
  Filled 2018-07-22 (×2): qty 50

## 2018-07-22 MED ORDER — POTASSIUM CHLORIDE 10 MEQ/100ML IV SOLN
10.0000 meq | INTRAVENOUS | Status: AC
  Administered 2018-07-22 – 2018-07-23 (×6): 10 meq via INTRAVENOUS
  Filled 2018-07-22 (×6): qty 100

## 2018-07-22 MED ORDER — SODIUM CHLORIDE 0.9 % IV BOLUS (SEPSIS)
1000.0000 mL | Freq: Once | INTRAVENOUS | Status: AC
  Administered 2018-07-22: 19:00:00 1000 mL via INTRAVENOUS

## 2018-07-22 MED ORDER — SODIUM CHLORIDE 0.9 % IV SOLN
2.0000 g | Freq: Once | INTRAVENOUS | Status: AC
  Administered 2018-07-22: 22:00:00 2 g via INTRAVENOUS
  Filled 2018-07-22: qty 2

## 2018-07-22 MED ORDER — SODIUM CHLORIDE 0.9 % IV SOLN
INTRAVENOUS | Status: AC | PRN
  Administered 2018-07-22: 1000 mL via INTRAVENOUS

## 2018-07-22 MED ORDER — METRONIDAZOLE IN NACL 5-0.79 MG/ML-% IV SOLN
500.0000 mg | Freq: Once | INTRAVENOUS | Status: AC
  Administered 2018-07-22: 500 mg via INTRAVENOUS
  Filled 2018-07-22: qty 100

## 2018-07-22 MED ORDER — VANCOMYCIN HCL 1.5 G IV SOLR
1500.0000 mg | Freq: Once | INTRAVENOUS | Status: DC
  Filled 2018-07-22: qty 1500

## 2018-07-22 MED ORDER — PIPERACILLIN-TAZOBACTAM 3.375 G IVPB
3.3750 g | Freq: Three times a day (TID) | INTRAVENOUS | Status: DC
  Administered 2018-07-23 (×3): 3.375 g via INTRAVENOUS
  Filled 2018-07-22 (×3): qty 50

## 2018-07-22 MED ORDER — NOREPINEPHRINE 4 MG/250ML-% IV SOLN
0.0000 ug/min | INTRAVENOUS | Status: DC
  Administered 2018-07-23: 3 ug/min via INTRAVENOUS
  Filled 2018-07-22: qty 250

## 2018-07-22 MED ORDER — PROPOFOL 1000 MG/100ML IV EMUL
0.0000 ug/kg/min | INTRAVENOUS | Status: DC
  Administered 2018-07-23: 15 ug/kg/min via INTRAVENOUS
  Filled 2018-07-22: qty 100

## 2018-07-22 MED ORDER — DOCUSATE SODIUM 50 MG/5ML PO LIQD: 100.0000 mg | Freq: Two times a day (BID) | ORAL | Status: DC | PRN

## 2018-07-22 MED ORDER — SODIUM CHLORIDE 0.9 % IV SOLN: 1.0000 g | Freq: Once | INTRAVENOUS | Status: DC

## 2018-07-22 MED ORDER — MAGNESIUM SULFATE IN D5W 1-5 GM/100ML-% IV SOLN
1.0000 g | Freq: Once | INTRAVENOUS | Status: DC
  Filled 2018-07-22: qty 100

## 2018-07-22 MED ORDER — MIDAZOLAM 50MG/50ML (1MG/ML) PREMIX INFUSION
0.5000 mg/h | INTRAVENOUS | Status: DC
  Filled 2018-07-22: qty 50

## 2018-07-22 MED ORDER — ETOMIDATE 2 MG/ML IV SOLN
INTRAVENOUS | Status: AC | PRN
  Administered 2018-07-22: 20 mg via INTRAVENOUS

## 2018-07-22 MED ORDER — VANCOMYCIN HCL 10 G IV SOLR
1500.0000 mg | Freq: Once | INTRAVENOUS | Status: DC
  Filled 2018-07-22: qty 1500

## 2018-07-22 MED ORDER — FENTANYL 2500MCG IN NS 250ML (10MCG/ML) PREMIX INFUSION
0.0000 ug/h | INTRAVENOUS | Status: DC
  Administered 2018-07-22: 19:00:00 25 ug/h via INTRAVENOUS
  Administered 2018-07-23: 09:00:00 200 ug/h via INTRAVENOUS
  Filled 2018-07-22 (×2): qty 250

## 2018-07-22 MED ORDER — MAGNESIUM SULFATE 4 GM/100ML IV SOLN
4.0000 g | Freq: Once | INTRAVENOUS | Status: AC
  Administered 2018-07-23: 4 g via INTRAVENOUS
  Filled 2018-07-22: qty 100

## 2018-07-22 MED ORDER — CHLORHEXIDINE GLUCONATE CLOTH 2 % EX PADS
6.0000 | MEDICATED_PAD | Freq: Every day | CUTANEOUS | Status: DC
  Administered 2018-07-22: 6 via TOPICAL
  Filled 2018-07-22: qty 6

## 2018-07-22 MED ORDER — SODIUM CHLORIDE 0.9 % IV BOLUS (SEPSIS)
500.0000 mL | Freq: Once | INTRAVENOUS | Status: AC
  Administered 2018-07-22: 500 mL via INTRAVENOUS

## 2018-07-22 NOTE — ED Notes (Signed)
This RN and RT accompanying pt to imaging.

## 2018-07-22 NOTE — ED Notes (Signed)
2nd grn/blue tubes sent to lab.

## 2018-07-22 NOTE — ED Notes (Signed)
IV team to collect one set of blood cultures and lab to collect 2nd set. Antibiotics will be started once cultures collected and more IV access available.

## 2018-07-22 NOTE — ED Notes (Signed)
EDP Archie Balboa notified of pt's critical lab values; EDP Goodman verbal that he'll assess values and add orders as needed.

## 2018-07-22 NOTE — Progress Notes (Signed)
CODE SEPSIS - PHARMACY COMMUNICATION  **Broad Spectrum Antibiotics should be administered within 1 hour of Sepsis diagnosis**  Time Code Sepsis Called/Page Received: n/a  Antibiotics Ordered: vanc/flagyl/cefepime  Time of 1st antibiotic administration: 2158  Additional action taken by pharmacy:   If necessary, Name of Provider/Nurse Contacted:     Tobie Lords ,PharmD Clinical Pharmacist  2018/08/03  11:22 PM

## 2018-07-22 NOTE — ED Triage Notes (Signed)
Pt presents to ED via [facility] from home c/o possible aspiration following seizure today. Pt arrives unresponsive, given x1 duoneb PTA for O2 sat 70s on RA. EMS reported temp 101.6 with rhonchi in all lung fields. Pt moved from rm 16 to rm 11. O2 sat 100% on 15L NRB.

## 2018-07-22 NOTE — ED Notes (Signed)
Next lactic sent to lab.

## 2018-07-22 NOTE — Consult Note (Addendum)
Name: Stephen [name] MRN: [id number] DOB: 04/12/1926    ADMISSION DATE:  07/13/2018 CONSULTATION DATE: 07/29/2018  REFERRING MD : Dr. Jannifer Franklin   CHIEF COMPLAINT: Unresponsiveness   BRIEF PATIENT DESCRIPTION:  85 yo male admitted with sepsis, UTI, and acute hypoxic respiratory failure secondary to aspiration pneumonia requiring mechanical ventilation   SIGNIFICANT EVENTS/STUDIES:  06/15-Pt admitted to ICU mechanically intubated  06/15-CT Head revealed no acute intracranial abnormality identified. Stable chronic microvascular ischemic changes and parenchymal volume loss of the brain. Stable chronic infarctions in the right basal ganglia and right cerebellum. Decreased size of chronic hematoma within the left thalamus  HISTORY OF PRESENT ILLNESS:   This is an 85 yo male with a PMH of Right Cerebellar CVA, Left Thalamic Intraparenchymal  Hemorrhage (06/08/2018), CAD, MI, HTN, and Type II Diabetes Mellitus.  He presented to [facility] ER via EMS from home with unresponsiveness.  He was recently discharged from [facility] to a rehab facility following a left thalamic parenchymal hemorrhage.  Per ER notes the pts daughter reported she checked on the pt earlier during the day on 06/15, and at that time the pt was okay. However, she checked on him again the evening of 06/15, and found the pt unresponsive and having trouble breathing.  She suspected the pt vomited resulting in aspiration, no seizure activity reported.  Upon EMS arrival pt febrile temp 101.6 degrees F and hypoxic on RA O2 sats in the 70's, therefore he received a duoneb treatment.  Upon arrival to the ER pt initially placed on NRB, but required mechanical intubation. Lab results revealed K+ 2.0, chloride 127, CO2 12, glucose 72, creatinine 0.47, calcium 4.2, troponin <0.03, lactic acid 4.5, wbc 2.0, PT 34.0, INR 3.4, UA positive for UTI, and ABG pH 7.32/pCO2 46/pO2 110.  COVID-19 negative and CT Head negative for acute intracranial abnormality.   CXR concerning for possible aspiration.  Pt received cefepime, vancomycin, metronidazole, and 3.5L NS bolus.  He was subsequently admitted to ICU by hospitalist team for additional workup and treatment.    PAST MEDICAL HISTORY :   has a past medical history of Diabetes mellitus without complication ([place]), Hypertension, MI (myocardial infarction) ([place]), and Stroke (cerebrum) ([place]).  has a past surgical history that includes Esophagogastroduodenoscopy (N/A, 07/02/2016). Prior to Admission medications   Medication Sig Start Date End Date Taking? Authorizing Provider  amLODipine (NORVASC) 10 MG tablet Take 10 mg by mouth daily. 05/09/16  Yes [provider]  aspirin EC 81 MG tablet Take 1 tablet (81 mg total) by mouth daily. 05/11/16  Yes Wieting, Richard, MD  atorvastatin (LIPITOR) 40 MG tablet Take 40 mg by mouth daily. 04/11/16  Yes [provider]  carvedilol (COREG) 3.125 MG tablet Take 3.125 mg by mouth 2 (two) times daily with a meal.   Yes [provider]  clopidogrel (PLAVIX) 75 MG tablet Take 1 tablet (75 mg total) by mouth daily. 05/11/16  Yes Wieting, Richard, MD  ferrous sulfate 325 (65 FE) MG tablet Take 325 mg by mouth daily with breakfast.   Yes [provider]  lisinopril (PRINIVIL,ZESTRIL) 20 MG tablet Take 20 mg by mouth daily.  05/09/16  Yes [provider]  metFORMIN (GLUCOPHAGE) 500 MG tablet Take 500 mg by mouth daily. 05/09/16  Yes [provider]  pantoprazole (PROTONIX) 40 MG tablet Take 1 tablet (40 mg total) by mouth daily. 07/02/16  Yes Fritzi Mandes, MD   No Known Allergies  FAMILY HISTORY:  family history includes Hypertension in  an other family member. SOCIAL HISTORY:  reports that he has quit smoking. He has never used smokeless tobacco. He reports that he does not drink alcohol or use drugs.  REVIEW OF SYSTEMS:   Unable to assess pt intubated   SUBJECTIVE:  Unable to assess pt intubated   VITAL SIGNS: Temp:  [99.8 F  (37.7 C)] 99.8 F (37.7 C) (06/15 1927) Pulse Rate:  [117-134] 118 (06/15 2045) Resp:  [18-46] 19 (06/15 2045) BP: (69-128)/(55-99) 118/84 (06/15 2030) SpO2:  [95 %-100 %] 99 % (06/15 2045) FiO2 (%):  [80 %] 80 % (06/15 1916) Weight:  [100 kg] 100 kg (06/15 1838)  PHYSICAL EXAMINATION: General: acutely ill appearing male, NAD mechanically intubated  Neuro: sedated, PERRL, follows commands RUE and LLE only , purposeful movement LUE HEENT: supple, no JVD  Cardiovascular: sinus tach, no R/G Lungs: diminished throughout, even, non labored  Abdomen: +BS x4, obese, soft, non distended Musculoskeletal: normal bulk and tone, no edema  Skin: right 5th toe laceration   Recent Labs  Lab 07/27/2018 2000  NA 144  K 2.0*  CL 127*  CO2 12*  BUN 7*  CREATININE 0.47*  GLUCOSE 72   Recent Labs  Lab 07/30/2018 1911  HGB 16.0  HCT 49.8  WBC 2.0*  PLT 289   Dg Abdomen 1 View  Result Date: 07/11/2018 CLINICAL DATA:  NG placement EXAM: ABDOMEN - 1 VIEW COMPARISON:  None. FINDINGS: NG tube not visualized. Numerous EKG wires are seen overlying the epigastric region. Normal bowel gas pattern. IMPRESSION: Normal bowel gas pattern.  NG tube not visualized. Electronically Signed   By: Franchot Gallo M.D.   On: 07/14/2018 20:03   Ct Head Wo Contrast  Result Date: 07/12/2018 CLINICAL DATA:  85 y/o M; Altered mental status (AMS), unclear cause unresponsive, possible COVID. EXAM: CT HEAD WITHOUT CONTRAST TECHNIQUE: Contiguous axial images were obtained from the base of the skull through the vertex without intravenous contrast. COMPARISON:  06/08/2018 CT head. FINDINGS: Brain: No evidence of acute infarction, acute hemorrhage, hydrocephalus, extra-axial collection or mass lesion/mass effect. Stable chronic infarction within the right superior cerebellum. Stable small chronic infarction within the right thalamus. Stable chronic microvascular ischemic changes and volume loss of the brain. Chronic hematoma  within the left thalamus is decreased in size measuring up to 20 mm, previously 26 mm. Vascular: Calcific atherosclerosis of carotid siphons. No hyperdense vessel identified. Skull: Normal. Negative for fracture or focal lesion. Sinuses/Orbits: No acute finding. Other: Debris within the external auditory canals, likely cerumen. IMPRESSION: 1. No acute intracranial abnormality identified. 2. Stable chronic microvascular ischemic changes and parenchymal volume loss of the brain. Stable chronic infarctions in the right basal ganglia and right cerebellum. 3. Decreased size of chronic hematoma within the left thalamus. Electronically Signed   By: Kristine Garbe M.D.   On: 08/06/2018 20:21   Dg Chest Port 1 View  Result Date: 07/12/2018 CLINICAL DATA:  85 y/o  M; possible aspiration following seizure. EXAM: PORTABLE CHEST 1 VIEW COMPARISON:  08/14/2011 chest radiograph. FINDINGS: Stable cardiac silhouette within normal limits given projection and technique. Endotracheal tube tip projects 4.5 cm above the carina. Increased bronchitic changes in the left lung base. No pleural effusion or pneumothorax. Bones are unremarkable. IMPRESSION: Increased bronchitic changes in the left lung base may reflect aspiration. No focal consolidation. Endotracheal tube tip projects 4.5 cm above the carina. Electronically Signed   By: Kristine Garbe M.D.   On: 07/16/2018 19:32    ASSESSMENT / PLAN:  Acute hypoxic respiratory failure secondary to aspiration pneumonia  Mechanical intubation  Full vent support for now-vent settings reviewed and established  SBT once all parameters met  VAP bundle implemented Prn bronchodilator therapy   Hypotension secondary to sepsis  Mildly elevated troponin likely demand ischemia secondary to respiratory failure  Hx: HTN Continuous telemetry monitoring Trend troponin's Maintain map >65 Hold outpatient antihypertensives  Continue aspirin and  atorvastatin  Hypokalemia  Hypocalcemia Hypomagnesia Lactic acidosis-resolved   Trend BMP  Replace electrolytes as indicated  Monitor UOP  UTI Aspiration pneumonia  Trend WBC and monitor fever curve  Trend PCT  Follow cultures  Will start zosyn and discontinue cefepime if MRSA PCR negative will discontinue vancomycin   Elevate coags  Hx: recent  left thalamic intraparenchymal  hemorrhage (06/08/2018) VTE px: SCD's, will avoid chemical prophylaxis for now  Trend coags and CBC  Monitor for s/sx of bleeding and transfuse for hgb <7  Type II diabetes mellitus  CBG's q4hrs   Acute encephalopathy-improving  Mechanical intubation discomfort/pain  Hx: Right cerebellar CVA and recent left thalamic intraparenchymal  hemorrhage (06/08/2018) Maintain RASS goal 0 to -1 Fentanyl gtt to maintain RASS goal  WUA daily   SUP px: iv pepcid  If pt remains mechanically intubated the next 24hrs will start TF's  Marda Stalker, [place] Pager [phone] (please enter 7 digits) PCCM Consult Pager [phone] (please enter 7 digits)

## 2018-07-22 NOTE — ED Notes (Signed)
IV team at bedside 

## 2018-07-22 NOTE — Progress Notes (Signed)
eLink Physician-Brief Progress Note Patient Name: Stephen [name] DOB: 12/02/1926 MRN: [id number]   Date of Service  07/08/2018  HPI/Events of Note  85 y/o M history of CVA presented with altered mental status and respiratory distress. Intubated for respiratory failure likely from aspiration but also with UTI  eICU Interventions   Lactic acidosis improved with fluids and antibiotics  Replace K  Vent support        Judd Lien 07/10/2018, 11:04 PM

## 2018-07-22 NOTE — ED Notes (Signed)
Lab staff at bedside. IV team continuing attempt for access.

## 2018-07-22 NOTE — ED Provider Notes (Addendum)
[facility] Emergency Department Provider Note   ____________________________________________   First MD Initiated Contact with Patient 08/03/2018 1907     (approximate)  I have reviewed the triage vital signs and the nursing notes.   HISTORY  Chief Complaint Other (Unresponsive)  EM caveat: Patient completely unresponsive  HPI Stephen [name] is a 85 y.o. male spoke with the patient's daughter, she reports she checks on the patient twice a day and he recently got out of [facility] after a stroke and rehabilitation.  Stepdown earlier seem to be okay, she came back to check on him and he was almost unresponsive, he was having trouble breathing and she suspects that he had vomited and aspirated.  They are also concerned he could have a urinary tract infection as he is been fatigued.  There is initial report by EMS that the patient could have had a seizure, but none daughter on the phone and also when I spoke to neither reports there was anything that looked like a seizure.    Past Medical History:  Diagnosis Date   Diabetes mellitus without complication (HCC)    Hypertension    MI (myocardial infarction) (HCC)    Stroke (cerebrum) [facility](HCC)     Patient Active Problem List   Diagnosis Date Noted   GI bleed 06/30/2016   TIA (transient ischemic attack) 05/11/2016   Dysarthria 05/09/2016    Past Surgical History:  Procedure Laterality Date   ESOPHAGOGASTRODUODENOSCOPY N/A 07/02/2016   Procedure: ESOPHAGOGASTRODUODENOSCOPY (EGD);  Surgeon: Lacey JensenSolik, Steven, MD;  Location: [facility] ENDOSCOPY;  Service: Gastroenterology;  Laterality: N/A;    Prior to Admission medications   Medication Sig Start Date End Date Taking? Authorizing Provider  amLODipine (NORVASC) 10 MG tablet Take 10 mg by mouth daily. 05/09/16  Yes [provider]  aspirin EC 81 MG tablet Take 1 tablet (81 mg total) by mouth daily. 05/11/16  Yes Wieting, Richard, MD  atorvastatin  (LIPITOR) 40 MG tablet Take 40 mg by mouth daily. 04/11/16  Yes [provider]  carvedilol (COREG) 3.125 MG tablet Take 3.125 mg by mouth 2 (two) times daily with a meal.   Yes [provider]  clopidogrel (PLAVIX) 75 MG tablet Take 1 tablet (75 mg total) by mouth daily. 05/11/16  Yes Wieting, Richard, MD  ferrous sulfate 325 (65 FE) MG tablet Take 325 mg by mouth daily with breakfast.   Yes [provider]  lisinopril (PRINIVIL,ZESTRIL) 20 MG tablet Take 20 mg by mouth daily.  05/09/16  Yes [provider]  metFORMIN (GLUCOPHAGE) 500 MG tablet Take 500 mg by mouth daily. 05/09/16  Yes [provider]  pantoprazole (PROTONIX) 40 MG tablet Take 1 tablet (40 mg total) by mouth daily. 07/02/16  Yes Enedina FinnerPatel, Sona, MD    Allergies Patient has no known allergies.  Family History  Problem Relation Age of Onset   Hypertension Other     Social History Social History   Tobacco Use   Smoking status: Former Smoker   Smokeless tobacco: Never Used  Substance Use Topics   Alcohol use: No   Drug use: No    Review of Systems EM caveat  ____________________________________________   PHYSICAL EXAM:  VITAL SIGNS: ED Triage Vitals  Enc Vitals Group     BP 07/17/2018 1838 (!) 118/99     Pulse Rate 07/21/2018 1838 (!) 130     Resp 08/04/2018 1838 (!) 33     Temp 07/26/2018 1927 99.8 F (37.7 C)  Temp Source 07/21/2018 1927 Axillary     SpO2 07/12/2018 1853 100 %     Weight 08/01/2018 1838 220 lb 7.4 oz (100 kg)     Height 07/16/2018 1838  (1.854 m)     Head Circumference --      Peak Flow --      Pain Score --      Pain Loc --      Pain Edu? --      Excl. in GC? --     Constitutional: Unresponsive, distressed breathing.  Notable rhonchi even without auscultation noted and poor handling of secretions Eyes: Conjunctivae are normal.  Head: Atraumatic. Nose: No congestion/rhinnorhea. Mouth/Throat: Mucous membranes are dry. Neck: No stridor.    Cardiovascular: Tachycardic rate, regular rhythm. Grossly normal heart sounds.  Good peripheral circulation. Respiratory: Tachypnea, poor respiratory effort, oxygen saturation high 80s on nebulizer initiated by EMS.  Bilateral rhonchorous lung sounds Gastrointestinal: Soft no distention Musculoskeletal: No lower extremity tenderness nor edema no obvious traumatic injuries. Neurologic: Unresponsive, responds only grimacing to pain Skin:  Skin is warm, dry and intact. No rash noted.  Did not log roll and examine examine back   ____________________________________________   LABS (all labs ordered are listed, but only abnormal results are displayed)  Labs Reviewed  LACTIC ACID, PLASMA - Abnormal; Notable for the following components:      Result Value   Lactic Acid, Venous 4.5 (*)    All other components within normal limits  CBC WITH DIFFERENTIAL/PLATELET - Abnormal; Notable for the following components:   WBC 2.0 (*)    Lymphs Abs 0.2 (*)    Monocytes Absolute 0.0 (*)    All other components within normal limits  URINALYSIS, ROUTINE W REFLEX MICROSCOPIC - Abnormal; Notable for the following components:   Color, Urine AMBER (*)    APPearance CLOUDY (*)    Glucose, UA   (*)    Value: TEST NOT REPORTED DUE TO COLOR INTERFERENCE OF URINE PIGMENT   Hgb urine dipstick   (*)    Value: TEST NOT REPORTED DUE TO COLOR INTERFERENCE OF URINE PIGMENT   Bilirubin Urine   (*)    Value: TEST NOT REPORTED DUE TO COLOR INTERFERENCE OF URINE PIGMENT   Ketones, ur   (*)    Value: TEST NOT REPORTED DUE TO COLOR INTERFERENCE OF URINE PIGMENT   Protein, ur   (*)    Value: TEST NOT REPORTED DUE TO COLOR INTERFERENCE OF URINE PIGMENT   Nitrite   (*)    Value: TEST NOT REPORTED DUE TO COLOR INTERFERENCE OF URINE PIGMENT   Leukocytes,Ua   (*)    Value: TEST NOT REPORTED DUE TO COLOR INTERFERENCE OF URINE PIGMENT   RBC / HPF >50 (*)    All other components within normal limits  CULTURE, BLOOD (ROUTINE  X 2)  CULTURE, BLOOD (ROUTINE X 2)  SARS CORONAVIRUS 2 (HOSPITAL ORDER, PERFORMED IN [facility])  LACTIC ACID, PLASMA  BLOOD GAS, ARTERIAL  COMPREHENSIVE METABOLIC PANEL  LIPASE, BLOOD  PROTIME-INR  TROPONIN I  CBG MONITORING, ED   ____________________________________________  EKG  Reviewed and entered by me at 1840 Tachycardic, rate about 130, suspect possibly representative A. fib hard to pick up sinus. No obvious acute ischemia or ST elevation, nonspecific T wave abnormality noted in multiple leads.  Suspect left anterior fascicular block ____________________________________________  RADIOLOGY  Dg Abdomen 1 View  Result Date: 08/01/2018 CLINICAL DATA:  NG placement EXAM: ABDOMEN - 1  VIEW COMPARISON:  None. FINDINGS: NG tube not visualized. Numerous EKG wires are seen overlying the epigastric region. Normal bowel gas pattern. IMPRESSION: Normal bowel gas pattern.  NG tube not visualized. Electronically Signed   By: Marlan Palauharles  Clark M.D.   On: 07/18/2018 20:03   Ct Head Wo Contrast  Result Date: 07/21/2018 CLINICAL DATA:  85 y/o M; Altered mental status (AMS), unclear cause unresponsive, possible COVID. EXAM: CT HEAD WITHOUT CONTRAST TECHNIQUE: Contiguous axial images were obtained from the base of the skull through the vertex without intravenous contrast. COMPARISON:  06/08/2018 CT head. FINDINGS: Brain: No evidence of acute infarction, acute hemorrhage, hydrocephalus, extra-axial collection or mass lesion/mass effect. Stable chronic infarction within the right superior cerebellum. Stable small chronic infarction within the right thalamus. Stable chronic microvascular ischemic changes and volume loss of the brain. Chronic hematoma within the left thalamus is decreased in size measuring up to 20 mm, previously 26 mm. Vascular: Calcific atherosclerosis of carotid siphons. No hyperdense vessel identified. Skull: Normal. Negative for fracture or focal lesion. Sinuses/Orbits: No  acute finding. Other: Debris within the external auditory canals, likely cerumen. IMPRESSION: 1. No acute intracranial abnormality identified. 2. Stable chronic microvascular ischemic changes and parenchymal volume loss of the brain. Stable chronic infarctions in the right basal ganglia and right cerebellum. 3. Decreased size of chronic hematoma within the left thalamus. Electronically Signed   By: Mitzi HansenLance  Furusawa-Stratton M.D.   On: 07/31/2018 20:21   Dg Chest Port 1 View  Result Date: 07/26/2018 CLINICAL DATA:  85 y/o  M; possible aspiration following seizure. EXAM: PORTABLE CHEST 1 VIEW COMPARISON:  08/14/2011 chest radiograph. FINDINGS: Stable cardiac silhouette within normal limits given projection and technique. Endotracheal tube tip projects 4.5 cm above the carina. Increased bronchitic changes in the left lung base. No pleural effusion or pneumothorax. Bones are unremarkable. IMPRESSION: Increased bronchitic changes in the left lung base may reflect aspiration. No focal consolidation. Endotracheal tube tip projects 4.5 cm above the carina. Electronically Signed   By: Mitzi HansenLance  Furusawa-Stratton M.D.   On: 07/15/2018 19:32    CT head negative for an acute finding.  X-ray bronchitic changes.  Endotracheal tube 4 cm above the carina.  ____________________________________________   PROCEDURES  Procedure(s) performed: Intubation, intraosseous insertion  Procedure Name: Intubation Date/Time: 07/12/2018 7:00 PM Performed by: Sharyn CreamerQuale, Unique Sillas, MD Pre-anesthesia Checklist: Patient identified, Patient being monitored, Emergency Drugs available, Timeout performed and Suction available Oxygen Delivery Method: Non-rebreather mask Preoxygenation: Pre-oxygenation with 100% oxygen Induction Type: Rapid sequence Ventilation: Mask ventilation without difficulty Laryngoscope Size: Glidescope and 4 Grade View: Grade I Tube type: Subglottic suction tube Tube size: 7.0 mm Number of attempts: 1 Airway  Equipment and Method: Patient positioned with wedge pillow and Video-laryngoscopy Placement Confirmation: ETT inserted through vocal cords under direct vision,  Breath sounds checked- equal and bilateral and Positive ETCO2 (Waveform capnography end-tidal 28 with Square waveforms measured over multiple breaths) Secured at: 26 cm Tube secured with: ETT holder Comments: Successful.  No episodes of desaturation.  No difficulty.  Dentures removed    IO LINE INSERTION  Date/Time: 07/15/2018 8:59 PM Performed by: Sharyn CreamerQuale, Miner Koral, MD Authorized by: Sharyn CreamerQuale, Anacleto Batterman, MD   Consent:    Consent obtained:  Emergent situation Anesthesia (see MAR for exact dosages):    Anesthesia method:  None Procedure details:    Insertion site:  R proximal tibia   Insertion device:  Drill device   Insertion: Needle was inserted through the bony cortex     Number of attempts:  1   Insertion confirmation:  Aspiration of blood/marrow, easy infusion of fluids and stability of the needle Post-procedure details:    Secured with:  Protective shield   Patient tolerance of procedure:  Tolerated well, no immediate complications Comments:     Successful insertion due to lack of initial peripheral IV access and need for emergent management.  Patient tolerated well.    Critical Care performed: Yes, see critical care note(s)  CRITICAL CARE Performed by: Sharyn CreamerMark Cerria Randhawa   Total critical care time: 65 minutes  Critical care time was exclusive of separately billable procedures and treating other patients.  Critical care was necessary to treat or prevent imminent or life-threatening deterioration.  Critical care was time spent personally by me on the following activities: development of treatment plan with patient and/or surrogate as well as nursing, discussions with consultants, evaluation of patient's response to treatment, examination of patient, obtaining history from patient or surrogate, ordering and performing treatments and  interventions, ordering and review of laboratory studies, ordering and review of radiographic studies, pulse oximetry and re-evaluation of patient's condition.  Extended critical care time, patient is critically ill, evidence of severe sepsis with septic shock and hypotension requiring large fluid boluses, intubation, close management, repeat evaluations, discussion with family regarding CODE STATUS, discussion with hospitalist service, admission to ICU.  ____________________________________________   INITIAL IMPRESSION / ASSESSMENT AND PLAN / ED COURSE  Pertinent labs & imaging results that were available during my care of the patient were reviewed by me and considered in my medical decision making (see chart for details).   Stephen GladdenJames L [name] was evaluated in Emergency Department on 07/20/2018 for the symptoms described in the history of present illness. He was evaluated in the context of the global COVID-19 pandemic, which necessitated consideration that the patient might be at risk for infection with the SARS-CoV-2 virus that causes COVID-19. Institutional protocols and algorithms that pertain to the evaluation of patients at risk for COVID-19 are in a state of rapid change based on information released by regulatory bodies including the CDC and federal and state organizations. These policies and algorithms were followed during the patient's care in the ED.  I utilized appropriate COVID precautions including use of PAPR.  Intubation performed with minimal providers in the room  Clinical Course as of Jul 21 2049  Mon Jul 22, 2018  2001 Patient tolerating the vent well.  Low-grade fever.  Antibiotics initiated.  Remains somewhat tachycardic, blood pressure low but map greater than 65.  Responding thus far to fluid bolus.  I have updated patient's daughter as well as her mother.  Wife who reports she is also patient's caretaker and decision-maker wishes for the patient to be DO NOT RESUSCITATE if  cardiac arrest occurs, however he is okay to remain on the ventilator and be intubated.   [MQ]  2023 Heart rate 120 at present, blood pressure 121/84.  Hemodynamics improving.  Remains on the ventilator   [MQ]    Clinical Course User Index [MQ] Sharyn CreamerQuale, Philip Eckersley, MD   ----------------------------------------- 8:40 PM on 07/13/2018 ----------------------------------------- Patient admitted to the hospitalist service.  Ongoing ER care and disposition assigned to Dr. Derrill KayGoodman.  Patient's chemistry is pending, remains critically ill, blood pressure and hemodynamics improving with resuscitation for septic shock likely due to aspiration possible UTI.   ____________________________________________   FINAL CLINICAL IMPRESSION(S) / ED DIAGNOSES  Final diagnoses:  Aspiration pneumonia, unspecified aspiration pneumonia type, unspecified laterality, unspecified part of lung (HCC)  Lactic acid acidosis  Acute  respiratory failure, unspecified whether with hypoxia or hypercapnia (HCC)  Hypokalemia, hypocalcemia      Note:  This document was prepared using Dragon voice recognition software and may include unintentional dictation errors       Delman Kitten, MD August 10, 2018 2120    Delman Kitten, MD 08-10-2018 2121

## 2018-07-22 NOTE — H&P (Addendum)
[facility] -  at [facility]   PATIENT NAME: Stephen [name]    MR#:  [id number]  DATE OF BIRTH:  27-Feb-1926  DATE OF ADMISSION:  07/29/2018  PRIMARY CARE PHYSICIAN: [facility]   REQUESTING/REFERRING PHYSICIAN: Fanny BienQuale, MD  CHIEF COMPLAINT:   Chief Complaint  Patient presents with  . Other    Unresponsive    HISTORY OF PRESENT ILLNESS:  Stephen [name]  is a 85 y.o. male who presents with chief complaint as above.  Patient presents the ED unresponsive and hypoxic.  He recently had a stroke.  He required intubation on arrival here.  Work-up here in the ED shows aspiration pneumonia and significant electrolyte derangements as well as possible UTI.  Hospitalist called for admission  PAST MEDICAL HISTORY:   Past Medical History:  Diagnosis Date  . Diabetes mellitus without complication (HCC)   . Hypertension   . MI (myocardial infarction) (HCC)   . Stroke (cerebrum) (HCC)      PAST SURGICAL HISTORY:   Past Surgical History:  Procedure Laterality Date  . ESOPHAGOGASTRODUODENOSCOPY N/A 07/02/2016   Procedure: ESOPHAGOGASTRODUODENOSCOPY (EGD);  Surgeon: Lacey JensenSolik, Steven, MD;  [facility];  Service: Gastroenterology;  Laterality: N/A;     SOCIAL HISTORY:   Social History   Tobacco Use  . Smoking status: Former Games developermoker  . Smokeless tobacco: Never Used  Substance Use Topics  . Alcohol use: No     FAMILY HISTORY:   Family History  Problem Relation Age of Onset  . Hypertension Other      DRUG ALLERGIES:  No Known Allergies  MEDICATIONS AT HOME:   Prior to Admission medications   Medication Sig Start Date End Date Taking? Authorizing Provider  amLODipine (NORVASC) 10 MG tablet Take 10 mg by mouth daily. 05/09/16  Yes [provider]  aspirin EC 81 MG tablet Take 1 tablet (81 mg total) by mouth daily. 05/11/16  Yes Wieting, Richard, MD  atorvastatin (LIPITOR) 40 MG tablet Take 40 mg by mouth daily.  04/11/16  Yes [provider]  carvedilol (COREG) 3.125 MG tablet Take 3.125 mg by mouth 2 (two) times daily with a meal.   Yes [provider]  clopidogrel (PLAVIX) 75 MG tablet Take 1 tablet (75 mg total) by mouth daily. 05/11/16  Yes Wieting, Richard, MD  ferrous sulfate 325 (65 FE) MG tablet Take 325 mg by mouth daily with breakfast.   Yes [provider]  lisinopril (PRINIVIL,ZESTRIL) 20 MG tablet Take 20 mg by mouth daily.  05/09/16  Yes [provider]  metFORMIN (GLUCOPHAGE) 500 MG tablet Take 500 mg by mouth daily. 05/09/16  Yes [provider]  pantoprazole (PROTONIX) 40 MG tablet Take 1 tablet (40 mg total) by mouth daily. 07/02/16  Yes Enedina FinnerPatel, Sona, MD    REVIEW OF SYSTEMS:  Review of Systems  Unable to perform ROS: Critical illness     VITAL SIGNS:   Vitals:   07/17/2018 2000 07/09/2018 2015 07/12/2018 2030 07/10/2018 2045  BP: 96/75 127/88 118/84   Pulse:  (!) 122 (!) 117 (!) 118  Resp: 18 18 18 19   Temp:      TempSrc:      SpO2:  98% 98% 99%  Weight:      Height:       Wt Readings from Last 3 Encounters:  07/22/18 100 kg  06/08/18 104.3 kg  06/30/16 91.2 kg    PHYSICAL EXAMINATION:  Physical Exam  Vitals reviewed. Constitutional: He  appears well-developed and well-nourished. No distress.  HENT:  Head: Normocephalic and atraumatic.  Mouth/Throat: Oropharynx is clear and moist.  Eyes: Pupils are equal, round, and reactive to light. Conjunctivae and EOM are normal. No scleral icterus.  Neck: Normal range of motion. Neck supple. No JVD present. No thyromegaly present.  Cardiovascular: Regular rhythm and intact distal pulses. Exam reveals no gallop and no friction rub.  No murmur heard. Tachycardic  Respiratory: He is in respiratory distress (Intubated and mechanically ventilated). He has no wheezes. He has no rales.  Rhonchi  GI: Soft. Bowel sounds are normal. He exhibits no distension. There is no abdominal tenderness.   Musculoskeletal: Normal range of motion.        General: No edema.     Comments: No arthritis, no gout  Lymphadenopathy:    He has no cervical adenopathy.  Neurological:  Unable to assess due to critical condition  Skin: Skin is warm and dry. No rash noted. No erythema.  Psychiatric:  Unable to assess due to critical condition     LABORATORY PANEL:   CBC Recent Labs  Lab 07/29/2018 1911  WBC 2.0*  HGB 16.0  HCT 49.8  PLT 289   ------------------------------------------------------------------------------------------------------------------  Chemistries  Recent Labs  Lab 07/30/2018 2000  NA 144  K 2.0*  CL 127*  CO2 12*  GLUCOSE 72  BUN 7*  CREATININE 0.47*  CALCIUM 4.2*  AST 23  ALT 21  ALKPHOS 60  BILITOT 0.7   ------------------------------------------------------------------------------------------------------------------  Cardiac Enzymes Recent Labs  Lab 08/04/2018 2000  TROPONINI 0.03*   ------------------------------------------------------------------------------------------------------------------  RADIOLOGY:  Dg Abdomen 1 View  Result Date: 08/03/2018 CLINICAL DATA:  NG placement EXAM: ABDOMEN - 1 VIEW COMPARISON:  None. FINDINGS: NG tube not visualized. Numerous EKG wires are seen overlying the epigastric region. Normal bowel gas pattern. IMPRESSION: Normal bowel gas pattern.  NG tube not visualized. Electronically Signed   By: Franchot Gallo M.D.   On: 08/03/2018 20:03   Ct Head Wo Contrast  Result Date: 07/28/2018 CLINICAL DATA:  85 y/o M; Altered mental status (AMS), unclear cause unresponsive, possible COVID. EXAM: CT HEAD WITHOUT CONTRAST TECHNIQUE: Contiguous axial images were obtained from the base of the skull through the vertex without intravenous contrast. COMPARISON:  06/08/2018 CT head. FINDINGS: Brain: No evidence of acute infarction, acute hemorrhage, hydrocephalus, extra-axial collection or mass lesion/mass effect. Stable chronic  infarction within the right superior cerebellum. Stable small chronic infarction within the right thalamus. Stable chronic microvascular ischemic changes and volume loss of the brain. Chronic hematoma within the left thalamus is decreased in size measuring up to 20 mm, previously 26 mm. Vascular: Calcific atherosclerosis of carotid siphons. No hyperdense vessel identified. Skull: Normal. Negative for fracture or focal lesion. Sinuses/Orbits: No acute finding. Other: Debris within the external auditory canals, likely cerumen. IMPRESSION: 1. No acute intracranial abnormality identified. 2. Stable chronic microvascular ischemic changes and parenchymal volume loss of the brain. Stable chronic infarctions in the right basal ganglia and right cerebellum. 3. Decreased size of chronic hematoma within the left thalamus. Electronically Signed   By: Kristine Garbe M.D.   On: 07/30/2018 20:21   Dg Chest Port 1 View  Result Date: 08/06/2018 CLINICAL DATA:  85 y/o  M; possible aspiration following seizure. EXAM: PORTABLE CHEST 1 VIEW COMPARISON:  08/14/2011 chest radiograph. FINDINGS: Stable cardiac silhouette within normal limits given projection and technique. Endotracheal tube tip projects 4.5 cm above the carina. Increased bronchitic changes in the left lung base. No pleural  effusion or pneumothorax. Bones are unremarkable. IMPRESSION: Increased bronchitic changes in the left lung base may reflect aspiration. No focal consolidation. Endotracheal tube tip projects 4.5 cm above the carina. Electronically Signed   By: Mitzi HansenLance  Furusawa-Stratton M.D.   On: 08/12/2018 19:32    EKG:   Orders placed or performed during the hospital encounter of 2018-10-03  . ED EKG 12-Lead  . ED EKG 12-Lead  . EKG 12-Lead  . EKG 12-Lead    IMPRESSION AND PLAN:  Principal Problem:   Severe sepsis (HCC) -due to aspiration pneumonia and UTI, lactic acid was initially elevated but corrected with IV fluids to within normal limits,  blood pressure was initially low but also corrected with IV fluid administration, culture sent from the ED, IV antibiotics administered Active Problems:   Acute respiratory failure with hypoxia (HCC) -patient is intubated and mechanically ventilated   Aspiration pneumonia (HCC) -IV antibiotics as above, intubation as above, other supportive treatment as needed   UTI (urinary tract infection) -IV antibiotics and culture as above   HTN (hypertension) -hold antihypertensives for now as the patient's blood pressure was initially low and is currently normotensive   Diabetes (HCC) -sliding scale insulin coverage   H/O: stroke -likely contributed to his risk for aspiration  Chart review performed and case discussed with ED provider. Labs, imaging and/or ECG reviewed by provider and discussed with patient/family. Management plans discussed with the patient and/or family.  COVID-19 status: Tested negative     DVT PROPHYLAXIS: SubQ lovenox   GI PROPHYLAXIS:  H2 blocker  ADMISSION STATUS: Inpatient     CODE STATUS: Partial, ok with intubation only Code Status History    Date Active Date Inactive Code Status Order ID Comments User Context   06/30/2016 1229 07/02/2016 1602 DNR [id number]  Altamese DillingVachhani, Vaibhavkumar, MD Inpatient   05/09/2016 2012 05/11/2016 1632 Full Code [id number]  Milagros LollSudini, Srikar, MD ED   Advance Care Planning Activity    Questions for Most Recent Historical Code Status (Order [id number])    Question Answer Comment   In the event of cardiac or respiratory ARREST Do not call a "code blue"    In the event of cardiac or respiratory ARREST Do not perform Intubation, CPR, defibrillation or ACLS    In the event of cardiac or respiratory ARREST Use medication by any route, position, wound care, and other measures to relive pain and suffering. May use oxygen, suction and manual treatment of airway obstruction as needed for comfort.       TOTAL CRITICAL CARE TIME TAKING CARE OF THIS PATIENT: 50  minutes.   This patient was evaluated in the context of the global COVID-19 pandemic, which necessitated consideration that the patient might be at risk for infection with the SARS-CoV-2 virus that causes COVID-19. Institutional protocols and algorithms that pertain to the evaluation of patients at risk for COVID-19 are in a state of rapid change based on information released by regulatory bodies including the CDC and federal and state organizations. These policies and algorithms were followed to the best of this provider's knowledge to date during the patient's care at this facility.  Barney DrainDavid F Cypher Paule 06-10-2018, 9:24 PM  [facility]  Office  [phone]  CC: Primary care physician; [facility]  Note:  This document was prepared using [employer] executive[employer]agon voice recognition software and may include unintentional dictation errors.

## 2018-07-22 NOTE — ED Notes (Signed)
EDP Quale spoke to Chryl Heck, pt's spouse. Per MD, pt may be intubated but she does NOT want CPR if pt's heart stops.

## 2018-07-22 NOTE — ED Notes (Signed)
Pt moving L arm trying to pull tube out; educating about tube; will start versed stat; RT/NT/this RN taking pt to ICU now.

## 2018-07-22 NOTE — ED Notes (Signed)
EDP Archie Balboa notified in person that another point of IV access not yet obtained post IV team and multiple other RNs attempting. EDP verbal to skip 2nd set of cultures and start antibiotics as soon as access available.

## 2018-07-22 NOTE — ED Notes (Signed)
Family [name] Malacara updated.

## 2018-07-22 NOTE — ED Notes (Signed)
Versed will be started (as needed/if BP maintains) once more IV access obtained. Pt calm/relaxed; has not moved arms/legs or opened eyes. IV bolus running through R hand IV paused until IV team done using R arm.

## 2018-07-22 NOTE — Progress Notes (Addendum)
Pharmacy Antibiotic Note  Stephen [name] is a 85 y.o. male admitted on 07/11/2018 with aspiration pneumonia.  Pharmacy has been consulted for vanc/zosyn dosing. Patient received vanc 1.5g IV load, cefepime 2g IV, zosyn 3.375g IV, flagyl 500 mg IV x 1 in ED.  Plan: Vancomycin 2000 mg IV Q 24 hrs. Goal AUC 400-550. Expected AUC: 512.9 SCr used: 0.8 Cssmin: 10.8  Zosyn 3.375g IV q8h (4 hour infusion).  Height: 5\' 11"  (180.3 cm) Weight: 199 lb 15.3 oz (90.7 kg) IBW/kg (Calculated) : 75.3  Temp (24hrs), Avg:99.4 F (37.4 C), Min:98.9 F (37.2 C), Max:99.8 F (37.7 C)  Recent Labs  Lab 07/19/2018 1910 07/18/2018 1911 07/13/2018 2000  WBC  --  2.0*  --   CREATININE  --   --  0.47*  LATICACIDVEN 4.5*  --  1.5    Estimated Creatinine Clearance: 72.2 mL/min (A) (by C-G formula based on SCr of 0.47 mg/dL (L)).    No Known Allergies  Thank you for allowing pharmacy to be a part of this patient's care.  Tobie Lords, PharmD, BCPS Clinical Pharmacist 07/29/2018 11:21 PM

## 2018-07-22 NOTE — ED Notes (Signed)
IV team unable to collect 2nd set of cultures or obtain another access point. Will notify EDP Quale.

## 2018-07-22 NOTE — ED Notes (Signed)
Per EDP Archie Balboa, EDP Paduchowski placed ultrasound IVs in pt's arms.

## 2018-07-22 NOTE — ED Notes (Signed)
Pt starting to move L arm as IV team works on gaining access. Fent inc to 59mcg.

## 2018-07-22 NOTE — Code Documentation (Addendum)
Pt intubated @ 1902, 25cm at lip

## 2018-07-22 NOTE — ED Notes (Signed)
Red/blue/grn/gray/lav tubes sent to lab. Pearline Cables sent on ice. Pulled from 22g in R hand.

## 2018-07-22 NOTE — ED Notes (Signed)
IV team now attempting for line at L arm.

## 2018-07-23 ENCOUNTER — Encounter: Payer: Self-pay | Admitting: Primary Care

## 2018-07-23 ENCOUNTER — Inpatient Hospital Stay: Payer: Medicare Other

## 2018-07-23 DIAGNOSIS — Z7189 Other specified counseling: Secondary | ICD-10-CM

## 2018-07-23 DIAGNOSIS — Z515 Encounter for palliative care: Secondary | ICD-10-CM

## 2018-07-23 DIAGNOSIS — J9600 Acute respiratory failure, unspecified whether with hypoxia or hypercapnia: Secondary | ICD-10-CM

## 2018-07-23 LAB — BASIC METABOLIC PANEL
Anion gap: 9 (ref 5–15)
BUN: 16 mg/dL (ref 8–23)
CO2: 21 mmol/L — ABNORMAL LOW (ref 22–32)
Calcium: 8.1 mg/dL — ABNORMAL LOW (ref 8.9–10.3)
Chloride: 109 mmol/L (ref 98–111)
Creatinine, Ser: 1.69 mg/dL — ABNORMAL HIGH (ref 0.61–1.24)
GFR calc Af Amer: 41 mL/min — ABNORMAL LOW (ref >60)
GFR calc non Af Amer: 35 mL/min — ABNORMAL LOW (ref >60)
Glucose, Bld: 168 mg/dL — ABNORMAL HIGH (ref 70–99)
Potassium: 4.9 mmol/L (ref 3.5–5.1)
Sodium: 139 mmol/L (ref 135–145)

## 2018-07-23 LAB — CBC WITH DIFFERENTIAL/PLATELET
Abs Immature Granulocytes: 0.39 10*3/uL — ABNORMAL HIGH (ref 0.00–0.07)
Basophils Absolute: 0 10*3/uL (ref 0.0–0.1)
Basophils Relative: 0 %
Eosinophils Absolute: 0 10*3/uL (ref 0.0–0.5)
Eosinophils Relative: 0 %
HCT: 36.7 % — ABNORMAL LOW (ref 39.0–52.0)
Hemoglobin: 11.8 g/dL — ABNORMAL LOW (ref 13.0–17.0)
Immature Granulocytes: 2 %
Lymphocytes Relative: 1 %
Lymphs Abs: 0.2 10*3/uL — ABNORMAL LOW (ref 0.7–4.0)
MCH: 28.2 pg (ref 26.0–34.0)
MCHC: 32.2 g/dL (ref 30.0–36.0)
MCV: 87.8 fL (ref 80.0–100.0)
Monocytes Absolute: 0.2 10*3/uL (ref 0.1–1.0)
Monocytes Relative: 1 %
Neutro Abs: 18.7 10*3/uL — ABNORMAL HIGH (ref 1.7–7.7)
Neutrophils Relative %: 96 %
Platelets: 223 10*3/uL (ref 150–400)
RBC: 4.18 MIL/uL — ABNORMAL LOW (ref 4.22–5.81)
RDW: 13.4 % (ref 11.5–15.5)
Smear Review: NORMAL
WBC: 19.5 10*3/uL — ABNORMAL HIGH (ref 4.0–10.5)
nRBC: 0 % (ref 0.0–0.2)

## 2018-07-23 LAB — BLOOD GAS, ARTERIAL
Acid-base deficit: 4.8 mmol/L — ABNORMAL HIGH (ref 0.0–2.0)
Bicarbonate: 21.1 mmol/L (ref 20.0–28.0)
FIO2: 1
MECHVT: 500 mL
Mechanical Rate: 18
O2 Saturation: 94.5 %
PEEP: 5 cmH2O
Patient temperature: 37
pCO2 arterial: 41 mmHg (ref 32.0–48.0)
pH, Arterial: 7.32 — ABNORMAL LOW (ref 7.350–7.450)
pO2, Arterial: 79 mmHg — ABNORMAL LOW (ref 83.0–108.0)

## 2018-07-23 LAB — GLUCOSE, CAPILLARY
Glucose-Capillary: 106 mg/dL — ABNORMAL HIGH (ref 70–99)
Glucose-Capillary: 143 mg/dL — ABNORMAL HIGH (ref 70–99)
Glucose-Capillary: 151 mg/dL — ABNORMAL HIGH (ref 70–99)
Glucose-Capillary: 160 mg/dL — ABNORMAL HIGH (ref 70–99)

## 2018-07-23 LAB — MRSA PCR SCREENING: MRSA by PCR: NEGATIVE

## 2018-07-23 LAB — PROTIME-INR
INR: 1.5 — ABNORMAL HIGH (ref 0.8–1.2)
Prothrombin Time: 17.8 seconds — ABNORMAL HIGH (ref 11.4–15.2)

## 2018-07-23 LAB — TRIGLYCERIDES: Triglycerides: 119 mg/dL (ref <150)

## 2018-07-23 LAB — MAGNESIUM: Magnesium: 2.5 mg/dL — ABNORMAL HIGH (ref 1.7–2.4)

## 2018-07-23 MED ORDER — POLYVINYL ALCOHOL 1.4 % OP SOLN
1.0000 [drp] | Freq: Four times a day (QID) | OPHTHALMIC | Status: DC | PRN
  Filled 2018-07-23: qty 15

## 2018-07-23 MED ORDER — GLYCOPYRROLATE 0.2 MG/ML IJ SOLN: 0.2000 mg | INTRAMUSCULAR | Status: DC | PRN

## 2018-07-23 MED ORDER — ORAL CARE MOUTH RINSE
15.0000 mL | OROMUCOSAL | Status: DC
  Administered 2018-07-23 (×5): 15 mL via OROMUCOSAL

## 2018-07-23 MED ORDER — CHLORHEXIDINE GLUCONATE 0.12% ORAL RINSE (MEDLINE KIT)
15.0000 mL | Freq: Two times a day (BID) | OROMUCOSAL | Status: DC
  Administered 2018-07-23: 09:00:00 15 mL via OROMUCOSAL

## 2018-07-23 MED ORDER — GLYCOPYRROLATE 1 MG PO TABS
1.0000 mg | ORAL_TABLET | ORAL | Status: DC | PRN
  Filled 2018-07-23: qty 1

## 2018-07-23 MED ORDER — ACETAMINOPHEN 325 MG PO TABS: 650.0000 mg | ORAL_TABLET | Freq: Four times a day (QID) | ORAL | Status: DC | PRN

## 2018-07-23 MED ORDER — DEXTROSE 5 % IV SOLN: INTRAVENOUS | Status: DC

## 2018-07-23 MED ORDER — LORAZEPAM 2 MG/ML IJ SOLN
2.0000 mg | INTRAMUSCULAR | Status: DC | PRN
  Administered 2018-07-23: 4 mg via INTRAVENOUS
  Filled 2018-07-23: qty 2

## 2018-07-23 MED ORDER — MORPHINE 100MG IN NS 100ML (1MG/ML) PREMIX INFUSION
0.0000 mg/h | INTRAVENOUS | Status: DC
  Administered 2018-07-23: 5 mg/h via INTRAVENOUS
  Filled 2018-07-23: qty 100

## 2018-07-23 MED ORDER — HYDROCORTISONE NA SUCCINATE PF 100 MG IJ SOLR
50.0000 mg | Freq: Four times a day (QID) | INTRAMUSCULAR | Status: DC
  Administered 2018-07-23 (×2): 50 mg via INTRAVENOUS
  Filled 2018-07-23 (×2): qty 2

## 2018-07-23 MED ORDER — DEXMEDETOMIDINE HCL IN NACL 400 MCG/100ML IV SOLN
0.4000 ug/kg/h | INTRAVENOUS | Status: DC
  Administered 2018-07-23: 14:00:00 0.6 ug/kg/h via INTRAVENOUS
  Filled 2018-07-23: qty 100

## 2018-07-23 MED ORDER — MORPHINE BOLUS VIA INFUSION
5.0000 mg | INTRAVENOUS | Status: DC | PRN
  Filled 2018-07-23: qty 5

## 2018-07-23 MED ORDER — MORPHINE 100MG IN NS 100ML (1MG/ML) PREMIX INFUSION: 0.0000 mg/h | INTRAVENOUS | Status: DC

## 2018-07-23 MED ORDER — MORPHINE SULFATE (PF) 2 MG/ML IV SOLN: 2.0000 mg | INTRAVENOUS | Status: DC | PRN

## 2018-07-23 MED ORDER — VANCOMYCIN HCL 10 G IV SOLR: 2000.0000 mg | INTRAVENOUS | Status: DC

## 2018-07-23 MED ORDER — NOREPINEPHRINE BITARTRATE 1 MG/ML IV SOLN
0.0000 ug/min | INTRAVENOUS | Status: DC
  Administered 2018-07-23 (×2): 40 ug/min via INTRAVENOUS
  Filled 2018-07-23 (×3): qty 16

## 2018-07-23 MED ORDER — LACTATED RINGERS IV BOLUS
1000.0000 mL | Freq: Once | INTRAVENOUS | Status: AC
  Administered 2018-07-23: 04:00:00 1000 mL via INTRAVENOUS

## 2018-07-23 MED ORDER — ACETAMINOPHEN 650 MG RE SUPP: 650.0000 mg | Freq: Four times a day (QID) | RECTAL | Status: DC | PRN

## 2018-07-23 MED ORDER — NOREPINEPHRINE 16 MG/250ML-% IV SOLN
0.0000 ug/min | INTRAVENOUS | Status: DC
  Filled 2018-07-23: qty 250

## 2018-07-23 MED ORDER — VASOPRESSIN 20 UNIT/ML IV SOLN
0.0300 [IU]/min | INTRAVENOUS | Status: DC
  Administered 2018-07-23: 0.03 [IU]/min via INTRAVENOUS
  Filled 2018-07-23: qty 2

## 2018-07-23 MED ORDER — DIPHENHYDRAMINE HCL 50 MG/ML IJ SOLN: 25.0000 mg | INTRAMUSCULAR | Status: DC | PRN

## 2018-07-23 MED ORDER — SODIUM CHLORIDE 0.9 % IV SOLN
0.0000 ug/min | INTRAVENOUS | Status: DC
  Administered 2018-07-23: 300 ug/min via INTRAVENOUS
  Administered 2018-07-23: 15:00:00 150 ug/min via INTRAVENOUS
  Administered 2018-07-23: 09:00:00 349.333 ug/min via INTRAVENOUS
  Administered 2018-07-23: 04:00:00 100 ug/min via INTRAVENOUS
  Administered 2018-07-23: 12:00:00 250 ug/min via INTRAVENOUS
  Filled 2018-07-23 (×2): qty 4
  Filled 2018-07-23: qty 40
  Filled 2018-07-23: qty 4
  Filled 2018-07-23: qty 40

## 2018-07-24 LAB — BLOOD CULTURE ID PANEL (REFLEXED)

## 2018-07-24 LAB — URINE CULTURE: Culture: NO GROWTH

## 2018-07-25 ENCOUNTER — Telehealth: Payer: Self-pay | Admitting: Internal Medicine

## 2018-07-25 LAB — CULTURE, RESPIRATORY W GRAM STAIN: Culture: NORMAL

## 2018-07-25 NOTE — Telephone Encounter (Signed)
Death certificate has been placed in DK's folder.  

## 2018-07-25 NOTE — Telephone Encounter (Signed)
Death certificate is ready for pickup.  [employer] is aware.

## 2018-07-26 LAB — CULTURE, BLOOD (ROUTINE X 2): Special Requests: ADEQUATE

## 2018-07-28 LAB — CULTURE, BLOOD (ROUTINE X 2)
Culture: NO GROWTH
Special Requests: ADEQUATE

## 2018-08-07 NOTE — Progress Notes (Signed)
Patient's family at bedside.  They are ready to terminally extubate the patient and make him comfort care measures only.  Kasa placed these orders.  Morphine infusion started, ativan given, and patient extubated.  At this time, patient appears comfortable.  Family continues to stay at bedside with the patient.

## 2018-08-07 NOTE — Progress Notes (Signed)
Pt extubated with comfort care measures

## 2018-08-07 NOTE — Progress Notes (Signed)
Pts wife, 2 daughters, and pts son arrived at bedside.  I discussed goals of care with pts family they all confirmed pt is a Limited Code Mechanical Intubation and Bipap ONLY.  I informed them pt is requiring multiple vasopressors and currently receiving 100% FiO2 via mechanical intubation. I discussed the option of continuing current treatment plan or transitioning the pt to comfort measures only.  They stated they would like to have a discussion with each other before making a decision, for now will continue with current plan of care. Will continue to monitor and assess pt.  Marda Stalker, [place] Pager [phone] (please enter 7 digits) PCCM Consult Pager [phone] (please enter 7 digits)

## 2018-08-07 NOTE — Procedures (Signed)
Central Venous Catheter Insertion Procedure Note CON ARGANBRIGHT [id number] 11/15/1926  Procedure: Insertion of Central Venous Catheter Indications: Assessment of intravascular volume, Drug and/or fluid administration and Frequent blood sampling  Procedure Details Consent: emergent  Time Out: Verified patient identification, verified procedure, site/side was marked, verified correct patient position, special equipment/implants available, medications/allergies/relevent history reviewed, required imaging and test results available.  Performed  Maximum sterile technique was used including antiseptics, cap, gloves, gown, hand hygiene, mask and sheet. Skin prep: Chlorhexidine; local anesthetic administered A antimicrobial bonded/coated triple lumen catheter was placed in the right internal jugular vein using the Seldinger technique.  Evaluation Blood flow good Complications: No apparent complications Patient did tolerate procedure well. Chest X-ray ordered to verify placement.  CXR: pending .  Right internal jugular CVL placed emergently utilizing ultrasound due to pt becoming severely hypotension requiring high doses of vasopressors.  Pt tolerated well no complications noted during or following procedure.  Marda Stalker, [place] Pager [phone] (please enter 7 digits) PCCM Consult Pager [phone] (please enter 7 digits)

## 2018-08-07 NOTE — Death Summary Note (Signed)
DEATH SUMMARY   Patient Details  Name: Stephen [name] MRN: [id number] DOB: 1926-10-19  Admission/Discharge Information   Admit Date:  October 09, 2018  Date of Death:   07/19/2018   Time of Death:  1559  Length of Stay: 1  Referring Physician: [facility]     Diagnoses  Preliminary cause of death: CVA, ASPIRATION PNEUMONIA,UTI,DM Secondary Diagnoses (including complications and co-morbidities):  Principal Problem:   Sepsis (HCC) Active Problems:   H/O: stroke   Acute respiratory failure (HCC)   Aspiration pneumonia (HCC)   UTI (urinary tract infection)   HTN (hypertension)   Diabetes (HCC)   Severe sepsis (HCC)   Goals of care, counseling/discussion   Palliative care by specialist   Brief Hospital Course (including significant findings, care, treatment, and services provided and events leading to death)  Stephen [name] is a 85 y.o. year old male who admitted to ICU for progressive resp failure from aspiration pneumonia with CVA, multiorgan failure on pressors   clinical status relayed to family Palliative care team consulted   Progressive multiorgan failure with very low chance of meaningful recovery.  Patient is in dying  Process.  Family understands the situation.  They have consented and agreed to DNR/DNI and would like to proceed with Comfort care measures.        Pertinent Labs and Studies  Significant Diagnostic Studies Dg Abd 1 View  Result Date: October 09, 2018 CLINICAL DATA:  OG tube placement. EXAM: ABDOMEN - 1 VIEW COMPARISON:  Radiograph earlier this day at 1932 hour FINDINGS: Multiple overlying monitoring devices obscure evaluation. The enteric tube is tentatively visualized with tip and side-port below the diaphragm in the stomach. Increased air within nondilated small bowel in the upper abdomen. IMPRESSION: Tip and side port of the enteric tube below the diaphragm in the stomach. Electronically Signed   By: Narda RutherfordMelanie  Sanford M.D.   On:  0September 02, 2020 23:58   Dg Abdomen 1 View  Result Date: October 09, 2018 CLINICAL DATA:  NG placement EXAM: ABDOMEN - 1 VIEW COMPARISON:  None. FINDINGS: NG tube not visualized. Numerous EKG wires are seen overlying the epigastric region. Normal bowel gas pattern. IMPRESSION: Normal bowel gas pattern.  NG tube not visualized. Electronically Signed   By: Marlan Palauharles  Clark M.D.   On: 0September 02, 2020 20:03   Ct Head Wo Contrast  Result Date: October 09, 2018 CLINICAL DATA:  85 y/o M; Altered mental status (AMS), unclear cause unresponsive, possible COVID. EXAM: CT HEAD WITHOUT CONTRAST TECHNIQUE: Contiguous axial images were obtained from the base of the skull through the vertex without intravenous contrast. COMPARISON:  06/08/2018 CT head. FINDINGS: Brain: No evidence of acute infarction, acute hemorrhage, hydrocephalus, extra-axial collection or mass lesion/mass effect. Stable chronic infarction within the right superior cerebellum. Stable small chronic infarction within the right thalamus. Stable chronic microvascular ischemic changes and volume loss of the brain. Chronic hematoma within the left thalamus is decreased in size measuring up to 20 mm, previously 26 mm. Vascular: Calcific atherosclerosis of carotid siphons. No hyperdense vessel identified. Skull: Normal. Negative for fracture or focal lesion. Sinuses/Orbits: No acute finding. Other: Debris within the external auditory canals, likely cerumen. IMPRESSION: 1. No acute intracranial abnormality identified. 2. Stable chronic microvascular ischemic changes and parenchymal volume loss of the brain. Stable chronic infarctions in the right basal ganglia and right cerebellum. 3. Decreased size of chronic hematoma within the left thalamus. Electronically Signed   By: Mitzi HansenLance  Furusawa-Stratton M.D.   On: 0September 02, 2020 20:21   [facility] [address]  Result Date: 07/30/2018 CLINICAL DATA:  Central line placement EXAM: PORTABLE CHEST 1 VIEW COMPARISON:  19-Aug-2018 FINDINGS: Endotracheal  tube remains in place, unchanged. NG tube enters the stomach. Right central line placement with the tip in the SVC. No pneumothorax. Worsening bibasilar airspace opacities, left greater than right. Heart is normal size. No effusions. No acute bony abnormality. IMPRESSION: Right central line tip in the SVC. No pneumothorax. NG tube enters the stomach. Increasing bibasilar opacities, left greater than right. Electronically Signed   By: Rolm Baptise M.D.   On: 07/15/2018 02:30   Dg Chest Port 1 View  Result Date: 08-19-18 CLINICAL DATA:  85 y/o  M; possible aspiration following seizure. EXAM: PORTABLE CHEST 1 VIEW COMPARISON:  08/14/2011 chest radiograph. FINDINGS: Stable cardiac silhouette within normal limits given projection and technique. Endotracheal tube tip projects 4.5 cm above the carina. Increased bronchitic changes in the left lung base. No pleural effusion or pneumothorax. Bones are unremarkable. IMPRESSION: Increased bronchitic changes in the left lung base may reflect aspiration. No focal consolidation. Endotracheal tube tip projects 4.5 cm above the carina. Electronically Signed   By: Kristine Garbe M.D.   On: 2018/08/19 19:32    Microbiology Recent Results (from the past 240 hour(s))  SARS Coronavirus 2 (CEPHEID- Performed in [facility] hospital lab), Hosp Order     Status: None   Collection Time: 08/19/2018  7:12 PM   Specimen: Urine, Catheterized; Nasopharyngeal  Result Value Ref Range Status   SARS Coronavirus 2 NEGATIVE NEGATIVE Final    Comment: (NOTE) If result is NEGATIVE SARS-CoV-2 target nucleic acids are NOT DETECTED. The SARS-CoV-2 RNA is generally detectable in upper and lower  respiratory specimens during the acute phase of infection. The lowest  concentration of SARS-CoV-2 viral copies this assay can detect is 250  copies / mL. A negative result does not preclude SARS-CoV-2 infection  and should not be used as the sole basis for treatment or other  patient  management decisions.  A negative result may occur with  improper specimen collection / handling, submission of specimen other  than nasopharyngeal swab, presence of viral mutation(s) within the  areas targeted by this assay, and inadequate number of viral copies  (<250 copies / mL). A negative result must be combined with clinical  observations, patient history, and epidemiological information. If result is POSITIVE SARS-CoV-2 target nucleic acids are DETECTED. The SARS-CoV-2 RNA is generally detectable in upper and lower  respiratory specimens dur ing the acute phase of infection.  Positive  results are indicative of active infection with SARS-CoV-2.  Clinical  correlation with patient history and other diagnostic information is  necessary to determine patient infection status.  Positive results do  not rule out bacterial infection or co-infection with other viruses. If result is PRESUMPTIVE POSTIVE SARS-CoV-2 nucleic acids MAY BE PRESENT.   A presumptive positive result was obtained on the submitted specimen  and confirmed on repeat testing.  While 2019 novel coronavirus  (SARS-CoV-2) nucleic acids may be present in the submitted sample  additional confirmatory testing may be necessary for epidemiological  and / or clinical management purposes  to differentiate between  SARS-CoV-2 and other Sarbecovirus currently known to infect humans.  If clinically indicated additional testing with an alternate test  methodology [phone]) is advised. The SARS-CoV-2 RNA is generally  detectable in upper and lower respiratory sp ecimens during the acute  phase of infection. The expected result is Negative. Fact Sheet for Patients:  StrictlyIdeas.no Fact Sheet for Healthcare  Providers: https://pope.com/https://www.fda.gov/media/136313/download This test is not yet approved or cleared by the [place] FDA and has been authorized for detection and/or diagnosis of SARS-CoV-2 by FDA under  an Emergency Use Authorization (EUA).  This EUA will remain in effect (meaning this test can be used) for the duration of the COVID-19 declaration under Section 564(b)(1) of the Act, 21 U.S.C. section 360bbb-3(b)(1), unless the authorization is terminated or revoked sooner. Performed at [facility], [address] [place], [place] [address]   Blood Culture (routine x 2)     Status: None (Preliminary result)   Collection Time: 02-17-2018  8:00 PM   Specimen: BLOOD  Result Value Ref Range Status   Specimen Description BLOOD BLOOD LEFT HAND  Final   Special Requests   Final    BOTTLES DRAWN AEROBIC AND ANAEROBIC Blood Culture adequate volume   Culture   Final    NO GROWTH < 12 HOURS Performed at [facility], [address] [place], [place] [address]    Report Status PENDING  Incomplete  MRSA PCR Screening     Status: None   Collection Time: 08/06/2018 12:32 AM   Specimen: Nasal Mucosa; Nasopharyngeal  Result Value Ref Range Status   MRSA by PCR NEGATIVE NEGATIVE Final    Comment:        The GeneXpert MRSA Assay (FDA approved for NASAL specimens only), is one component of a comprehensive MRSA colonization surveillance program. It is not intended to diagnose MRSA infection nor to guide or monitor treatment for MRSA infections. Performed at [facility], [address] [place], [place] [address]   Culture, respiratory (non-expectorated)     Status: None (Preliminary result)   Collection Time: 07/30/2018  1:19 AM   Specimen: Tracheal Aspirate; Respiratory  Result Value Ref Range Status   Specimen Description   Final    TRACHEAL ASPIRATE Performed at [facility], [address] [place], [place] [address]    Special Requests   Final    NONE Performed at [facility], [address] [place], [place] [address]    Gram Stain   Final    ABUNDANT WBC PRESENT, PREDOMINANTLY PMN ABUNDANT GRAM POSITIVE COCCI FEW GRAM NEGATIVE  RODS Performed at [facility], [address] [place], [place] [address]    Culture PENDING  Incomplete   Report Status PENDING  Incomplete    Lab Basic Metabolic Panel: Recent Labs  Lab 02-17-2018 2000 07/15/2018 0503  NA 144 139  K 2.0* 4.9  CL 127* 109  CO2 12* 21*  GLUCOSE 72 168*  BUN 7* 16  CREATININE 0.47* 1.69*  CALCIUM 4.2* 8.1*  MG 1.0* 2.5*   Liver Function Tests: Recent Labs  Lab 02-17-2018 2000  AST 23  ALT 21  ALKPHOS 60  BILITOT 0.7  PROT 3.1*  ALBUMIN 1.5*   Recent Labs  Lab 02-17-2018 2000  LIPASE 22   No results for input(s): AMMONIA in the last 168 hours. CBC: Recent Labs  Lab 02-17-2018 1911 08/03/2018 0538  WBC 2.0* 19.5*  NEUTROABS 1.9 18.7*  HGB 16.0 11.8*  HCT 49.8 36.7*  MCV 85.7 87.8  PLT 289 223   Cardiac Enzymes: Recent Labs  Lab 02-17-2018 2000  TROPONINI 0.03*   Sepsis Labs: Recent Labs  Lab 02-17-2018 1910 02-17-2018 1911 02-17-2018 2000 07/26/2018 0538  WBC  --  2.0*  --  19.5*  LATICACIDVEN 4.5*  --  1.5  --  Erin FullingKurian Alexandru Moorer 07/10/2018, 4:04 PM

## 2018-08-07 NOTE — ED Notes (Signed)
Cut to pt's toe on R foot currently bleeding; present on arrival to ER; EMS unsure of how injury occurred.

## 2018-08-07 NOTE — Consult Note (Signed)
Consultation Note Date: 08/13/2018   Patient Name: Stephen [name]  DOB: 1927-01-29  MRN: [id number]  Age / Sex: 85 y.o., male  PCP: [facility] [place] Referring Physician: Demetrios Loll, MD  Reason for Consultation: Establishing goals of care  HPI/Patient Profile: 85 y.o. male  with past medical history of DM, HTN, MI, recent stroke admitted on 07/24/2018 with severe sepsis due to aspiration PNE and UTI.    Clinical Assessment and Goals of Care: Stephen [name] is lying quietly in bed.  He is Intubated/ventilated, sedated, multiple vasopressors.  He does not respond to me in any way.  There is no family at bedside at this time. Conference with CCM attending and bedside nursing staff related to patient condition, needs.    Call to daughter [name] at [phone], wife [name] on speaker phone. [name] tells me that the family wishes to give Stephen [name] until this evening to see if he can have "some recovery".  GFwen shartes that family understands that he is criticlly ill and receiving full ventilator support with 100% oxygen.  They share that they will decide about compassionate extubation later this evening. If there is no improvements, family is requesting compassionate extubation.  We talk about family visitation, somewhat in detail.  [name] tells me that she feels comfortable with coordination visitation with nursing staff.  [name] has 10 children, and a few grand kids want to see him, also 2 nieces came earlier, but were not allowed to visit. .   We review the treatment plan in detail including ventilator support.  [name] tells me that Stephen [name] wishes were "no ventilator" and they want ot respect his wish.  [name] shares that family would like to be present when vent is withdrawn.  [name] asks if the pastor on call is "[name]", I share that I do not know.  Family would benefit from pastoral support during  this difficult time.   CCM, Nursing staff updated.    HCPOA     NEXT OF KIN - wife [name], Family makes choices together.  10 kids, 6 girls and 4 boys.   SUMMARY OF RECOMMENDATIONS   Family wanting to give Stephen [name] until this evening to see if he can have improvements.  Will decide for compassionate extubation by this evening.  Family wishes to be present for extubation.   Code Status/Advance Care Planning:  Limited code - BiPAP and intubation ONLY   Symptom Management:   Per hospitalist/CCM, no additional needs at this time.   Palliative Prophylaxis:   Frequent Pain Assessment, Oral Care and Turn Reposition  Additional Recommendations (Limitations, Scope, Preferences):  considering compassionate extubation, comfort care   Psycho-social/Spiritual:   Desire for further Chaplaincy support:no  Additional Recommendations: Caregiving  Support/Resources and Grief/Bereavement Support  Prognosis:   Unable to determine, hours to days likely.   Discharge Planning: Anticipated Hospital Death      Primary Diagnoses: Present on Admission: . Acute respiratory failure with hypoxia ([place]) . Sepsis ([place]) . Aspiration pneumonia (  [place]) . UTI (urinary tract infection) . HTN (hypertension) . Severe sepsis ([place])   I have reviewed the medical record, interviewed the patient and family, and examined the patient. The following aspects are pertinent.  Past Medical History:  Diagnosis Date  . Diabetes mellitus without complication ([place])   . Hypertension   . MI (myocardial infarction) ([place])   . Stroke (cerebrum) [facility])    Social History   Socioeconomic History  . Marital status: Married    Spouse name: Not on file  . Number of children: Not on file  . Years of education: Not on file  . Highest education level: Not on file  Occupational History  . Not on file  Social Needs  . Financial resource strain: Not on file  . Food insecurity    Worry: Not on file    Inability:  Not on file  . Transportation needs    Medical: Not on file    Non-medical: Not on file  Tobacco Use  . Smoking status: Former Research scientist (life sciences)  . Smokeless tobacco: Never Used  Substance and Sexual Activity  . Alcohol use: No  . Drug use: No  . Sexual activity: Not on file  Lifestyle  . Physical activity    Days per week: Not on file    Minutes per session: Not on file  . Stress: Not on file  Relationships  . Social Herbalist on phone: Not on file    Gets together: Not on file    Attends religious service: Not on file    Active member of club or organization: Not on file    Attends meetings of clubs or organizations: Not on file    Relationship status: Not on file  Other Topics Concern  . Not on file  Social History Narrative  . Not on file   Family History  Problem Relation Age of Onset  . Hypertension Other    Scheduled Meds: . chlorhexidine gluconate (MEDLINE KIT)  15 mL Mouth Rinse BID  . Chlorhexidine Gluconate Cloth  6 each Topical Q0600  . hydrocortisone sod succinate (SOLU-CORTEF) inj  50 mg Intravenous Q6H  . mouth rinse  15 mL Mouth Rinse 10 times per day   Continuous Infusions: . sodium chloride Stopped (08-14-2018 0545)  . famotidine (PEPCID) IV 20 mg (August 14, 2018 1057)  . fentaNYL infusion INTRAVENOUS 200 mcg/hr (2018/08/14 0904)  . norepinephrine (LEVOPHED) Adult infusion 40 mcg/min (2018-08-14 0948)  . phenylephrine (NEO-SYNEPHRINE) Adult infusion 250 mcg/min (Aug 14, 2018 1145)  . piperacillin-tazobactam (ZOSYN)  IV 12.5 mL/hr at 14-Aug-2018 0620  . vasopressin (PITRESSIN) infusion - *FOR SHOCK* 0.03 Units/min (Aug 14, 2018 0620)   PRN Meds:.sodium chloride, docusate, fentaNYL Medications Prior to Admission:  Prior to Admission medications   Medication Sig Start Date End Date Taking? Authorizing Provider  amLODipine (NORVASC) 10 MG tablet Take 10 mg by mouth daily. 05/09/16  Yes [provider]  aspirin EC 81 MG tablet Take 1 tablet (81 mg total) by mouth  daily. 05/11/16  Yes Wieting, Richard, MD  atorvastatin (LIPITOR) 40 MG tablet Take 40 mg by mouth daily. 04/11/16  Yes [provider]  carvedilol (COREG) 3.125 MG tablet Take 3.125 mg by mouth 2 (two) times daily with a meal.   Yes [provider]  clopidogrel (PLAVIX) 75 MG tablet Take 1 tablet (75 mg total) by mouth daily. 05/11/16  Yes Wieting, Richard, MD  ferrous sulfate 325 (65 FE) MG tablet Take 325 mg by mouth daily with breakfast.  Yes [provider]  lisinopril (PRINIVIL,ZESTRIL) 20 MG tablet Take 20 mg by mouth daily.  05/09/16  Yes [provider]  metFORMIN (GLUCOPHAGE) 500 MG tablet Take 500 mg by mouth daily. 05/09/16  Yes [provider]  pantoprazole (PROTONIX) 40 MG tablet Take 1 tablet (40 mg total) by mouth daily. 07/02/16  Yes Fritzi Mandes, MD   No Known Allergies Review of Systems  Unable to perform ROS: Intubated    Physical Exam Vitals signs and nursing note reviewed.  Constitutional:      Appearance: He is normal weight.     Comments: Intubated/ventilated, sedated   Cardiovascular:     Rate and Rhythm: Normal rate.  Pulmonary:     Comments: Intubated/ventilated, sedated  Musculoskeletal:        General: No swelling.  Skin:    General: Skin is warm and dry.  Neurological:     Comments: Intubated/ventilated, sedated   Psychiatric:     Comments: Intubated/ventilated, sedated      Vital Signs: BP (!) 79/62   Pulse 80   Temp (!) 100.9 F (38.3 C) (Bladder)   Resp 18   Ht '5\' 11"'$  (1.803 m)   Wt 90.7 kg   SpO2 98%   BMI 27.89 kg/m  Pain Scale: CPOT       SpO2: SpO2: 98 % O2 Device:SpO2: 98 % O2 Flow Rate: .O2 Flow Rate (L/min): 15 L/min  IO: Intake/output summary:   Intake/Output Summary (Last 24 hours) at 12-Aug-2018 1221 Last data filed at 12-Aug-2018 1000 Gross per 24 hour  Intake 3583.14 ml  Output 385 ml  Net 3198.14 ml    LBM: Last BM Date: Aug 12, 2018 Baseline Weight: Weight: 100 kg Most recent  weight: Weight: 90.7 kg     Palliative Assessment/Data:   Flowsheet Rows     Most Recent Value  Intake Tab  Referral Department  Hospitalist  Unit at Time of Referral  ICU  Palliative Care Primary Diagnosis  Pulmonary  Date Notified  08/12/18  Palliative Care Type  New Palliative care  Reason for referral  Clarify Goals of Care  Date of Admission  07/14/2018  Date first seen by Palliative Care  August 12, 2018  # of days Palliative referral response time  0 Day(s)  # of days IP prior to Palliative referral  1  Clinical Assessment  Palliative Performance Scale Score  10%  Pain Max last 24 hours  Not able to report  Pain Min Last 24 hours  Not able to report  Dyspnea Max Last 24 Hours  Not able to report  Dyspnea Min Last 24 hours  Not able to report  Psychosocial & Spiritual Assessment  Palliative Care Outcomes      Time In: 1140 Time Out: 1250 Time Total: 70 minutes  Greater than 50%  of this time was spent counseling and coordinating care related to the above assessment and plan.  Signed by: Drue Novel, NP   Please contact Palliative Medicine Team phone at [phone] for questions and concerns.  For individual provider: See Shea Evans

## 2018-08-07 NOTE — Progress Notes (Signed)
Pt has declined significantly requiring multiple vasopressors and he remains severely hypotensive with current map of 56.  I contacted pts wife Stephen [name] via telephone to inform her of significant decline in pts condition and pt HIGH risk for cardiac arrest.  I informed her she could come to the hospital, and she stated she would arrive at pts bedside shortly. She also confirmed pt is a Limited Code if the pt were to cardiac arrest she would NOT want the pt to receive CPR, ACLS medications, Defibrillation or Cardioversion.  Will continue to monitor and assess pt.  Marda Stalker, [place] Pager [phone] (please enter 7 digits) PCCM Consult Pager [phone] (please enter 7 digits)

## 2018-08-07 NOTE — Progress Notes (Signed)
Family At bedside, clinical status relayed to family by Palliative care team and ICU team   Progressive multiorgan failure with very low chance of meaningful recovery.  Patient is in dying  Process.  Family understands the situation.  They have consented and agreed to DNR/DNI and would like to proceed with Comfort care measures.   Family are satisfied with Plan of action and management. All questions answered  Corrin Parker, M.D.  [facility]  Medical Director [place] Director [facility] Cardio-Pulmonary Department

## 2018-08-07 NOTE — Progress Notes (Signed)
Pt family would like to continue treatment at this time.

## 2018-08-07 NOTE — Progress Notes (Signed)
Sputum culture sent

## 2018-08-07 NOTE — Progress Notes (Addendum)
[facility] NAME: Stephen [name]    MR#:  [id number]  DATE OF BIRTH:  07-03-1926  SUBJECTIVE:  CHIEF COMPLAINT:   Chief Complaint  Patient presents with   Other    Unresponsive   The patient is on ventilation and sedation, on vasopressor drip. REVIEW OF SYSTEMS:  Review of Systems  Unable to perform ROS: Intubated    DRUG ALLERGIES:  No Known Allergies VITALS:  Blood pressure (!) 69/58, pulse 80, temperature (!) 100.6 F (38.1 C), temperature source Bladder, resp. rate 18, height 5\' 11"  (1.803 m), weight 90.7 kg, SpO2 98 %. PHYSICAL EXAMINATION:  Physical Exam Constitutional:      Appearance: He is ill-appearing.  HENT:     Head: Normocephalic.  Eyes:     General: No scleral icterus.    Conjunctiva/sclera: Conjunctivae normal.     Pupils: Pupils are equal, round, and reactive to light.  Neck:     Musculoskeletal: Neck supple.     Vascular: No JVD.     Trachea: No tracheal deviation.  Cardiovascular:     Rate and Rhythm: Normal rate and regular rhythm.     Heart sounds: Normal heart sounds. No murmur. No gallop.   Pulmonary:     Effort: No respiratory distress.     Breath sounds: No stridor. Wheezing and rales present. No rhonchi.  Abdominal:     General: Bowel sounds are normal. There is no distension.     Palpations: Abdomen is soft.     Tenderness: There is no abdominal tenderness. There is no rebound.  Musculoskeletal:        General: No tenderness.  Skin:    Findings: No erythema or rash.  Neurological:     Comments: Unable to exam.    LABORATORY PANEL:  Male CBC Recent Labs  Lab 29-Jul-2018 0538  WBC 19.5*  HGB 11.8*  HCT 36.7*  PLT 223   ------------------------------------------------------------------------------------------------------------------ Chemistries  Recent Labs  Lab 08/03/2018 2000 July 29, 2018 0503  NA 144 139  K 2.0* 4.9  CL 127* 109  CO2 12* 21*  GLUCOSE 72 168*  BUN 7*  16  CREATININE 0.47* 1.69*  CALCIUM 4.2* 8.1*  MG 1.0* 2.5*  AST 23  --   ALT 21  --   ALKPHOS 60  --   BILITOT 0.7  --    RADIOLOGY:  Dg Abd 1 View  Result Date: 07/12/2018 CLINICAL DATA:  OG tube placement. EXAM: ABDOMEN - 1 VIEW COMPARISON:  Radiograph earlier this day at 1932 hour FINDINGS: Multiple overlying monitoring devices obscure evaluation. The enteric tube is tentatively visualized with tip and side-port below the diaphragm in the stomach. Increased air within nondilated small bowel in the upper abdomen. IMPRESSION: Tip and side port of the enteric tube below the diaphragm in the stomach. Electronically Signed   By: Keith Rake M.D.   On: 07/13/2018 23:58   Dg Abdomen 1 View  Result Date: 07/18/2018 CLINICAL DATA:  NG placement EXAM: ABDOMEN - 1 VIEW COMPARISON:  None. FINDINGS: NG tube not visualized. Numerous EKG wires are seen overlying the epigastric region. Normal bowel gas pattern. IMPRESSION: Normal bowel gas pattern.  NG tube not visualized. Electronically Signed   By: Franchot Gallo M.D.   On: 07/18/2018 20:03   Ct Head Wo Contrast  Result Date: 07/20/2018 CLINICAL DATA:  85 y/o M; Altered mental status (AMS), unclear cause unresponsive, possible COVID. EXAM: CT HEAD WITHOUT CONTRAST TECHNIQUE: Contiguous  axial images were obtained from the base of the skull through the vertex without intravenous contrast. COMPARISON:  06/08/2018 CT head. FINDINGS: Brain: No evidence of acute infarction, acute hemorrhage, hydrocephalus, extra-axial collection or mass lesion/mass effect. Stable chronic infarction within the right superior cerebellum. Stable small chronic infarction within the right thalamus. Stable chronic microvascular ischemic changes and volume loss of the brain. Chronic hematoma within the left thalamus is decreased in size measuring up to 20 mm, previously 26 mm. Vascular: Calcific atherosclerosis of carotid siphons. No hyperdense vessel identified. Skull: Normal.  Negative for fracture or focal lesion. Sinuses/Orbits: No acute finding. Other: Debris within the external auditory canals, likely cerumen. IMPRESSION: 1. No acute intracranial abnormality identified. 2. Stable chronic microvascular ischemic changes and parenchymal volume loss of the brain. Stable chronic infarctions in the right basal ganglia and right cerebellum. 3. Decreased size of chronic hematoma within the left thalamus. Electronically Signed   By: Mitzi HansenLance  Furusawa-Stratton M.D.   On: 07/24/2018 20:21   Dg Chest Port 1 View  Result Date: 2018-11-04 CLINICAL DATA:  Central line placement EXAM: PORTABLE CHEST 1 VIEW COMPARISON:  07/19/2018 FINDINGS: Endotracheal tube remains in place, unchanged. NG tube enters the stomach. Right central line placement with the tip in the SVC. No pneumothorax. Worsening bibasilar airspace opacities, left greater than right. Heart is normal size. No effusions. No acute bony abnormality. IMPRESSION: Right central line tip in the SVC. No pneumothorax. NG tube enters the stomach. Increasing bibasilar opacities, left greater than right. Electronically Signed   By: Charlett NoseKevin  Dover M.D.   On: 02020-09-28 02:30   Dg Chest Port 1 View  Result Date: 07/15/2018 CLINICAL DATA:  85 y/o  M; possible aspiration following seizure. EXAM: PORTABLE CHEST 1 VIEW COMPARISON:  08/14/2011 chest radiograph. FINDINGS: Stable cardiac silhouette within normal limits given projection and technique. Endotracheal tube tip projects 4.5 cm above the carina. Increased bronchitic changes in the left lung base. No pleural effusion or pneumothorax. Bones are unremarkable. IMPRESSION: Increased bronchitic changes in the left lung base may reflect aspiration. No focal consolidation. Endotracheal tube tip projects 4.5 cm above the carina. Electronically Signed   By: Mitzi HansenLance  Furusawa-Stratton M.D.   On: 07/16/2018 19:32   ASSESSMENT AND PLAN:   Severe sepsis, septic shock due to aspiration pneumonia and  UTI, Still hypotension. Continue vasopressor drip and IV fluid, continue IV antibiotics, follow-up CBC and blood cultures.    Acute respiratory failure with hypoxia Continue ventilation and sedation.    Aspiration pneumonia (HCC) -IV antibiotics as above, intubation as above, other supportive treatment as needed   UTI (urinary tract infection) -IV antibiotics and culture as above  Acute renal failure due to above.  Continue IV fluid support and follow-up BMP.  Hypokalemia.  Improved with supplement. Hypomagnesemia.  Improved with supplement.   HTN (hypertension) -hold antihypertensive medication due to septic shock.    Diabetes (HCC) -sliding scale insulin coverage   H/O: stroke -likely contributed to his risk for aspiration Very poor prognosis.  Family would like to continue treatment at this time. I discussed with Dr. Jayme CloudGonzalez. All the records are reviewed and case discussed with Care Management/Social Worker. Management plans discussed with the patient, family and they are in agreement.  CODE STATUS: Partial Code  TOTAL TIME TAKING CARE OF THIS PATIENT: 32 minutes.   More than 50% of the time was spent in counseling/coordination of care: YES  POSSIBLE D/C IN ? DAYS, DEPENDING ON CLINICAL CONDITION.   Shaune PollackQing Owynn Mosqueda M.D on 2018-11-04  at 10:38 AM  Between 7am to 6pm - Pager - [phone]  After 6pm go to [email or web address] - Therapist, nutritionalpassword [email or web address]  [facility]

## 2018-08-07 NOTE — Progress Notes (Signed)
Patient expired at 1559.

## 2018-08-07 NOTE — Progress Notes (Signed)
Pt's wife, [name], at bedside.

## 2018-08-07 DEATH — deceased
